# Patient Record
Sex: Female | Born: 1935 | Race: White | Hispanic: No | Smoking: Never smoker
Health system: Southern US, Community
[De-identification: ages and names within clinical notes are randomized; demographics above are authoritative.]

## PROBLEM LIST (undated history)

## (undated) DIAGNOSIS — I2699 Other pulmonary embolism without acute cor pulmonale: Secondary | ICD-10-CM

## (undated) DIAGNOSIS — K219 Gastro-esophageal reflux disease without esophagitis: Secondary | ICD-10-CM

## (undated) DIAGNOSIS — C189 Malignant neoplasm of colon, unspecified: Secondary | ICD-10-CM

## (undated) DIAGNOSIS — E039 Hypothyroidism, unspecified: Secondary | ICD-10-CM

## (undated) DIAGNOSIS — G8929 Other chronic pain: Secondary | ICD-10-CM

## (undated) DIAGNOSIS — F419 Anxiety disorder, unspecified: Secondary | ICD-10-CM

## (undated) DIAGNOSIS — I82409 Acute embolism and thrombosis of unspecified deep veins of unspecified lower extremity: Secondary | ICD-10-CM

## (undated) DIAGNOSIS — A4901 Methicillin susceptible Staphylococcus aureus infection, unspecified site: Secondary | ICD-10-CM

## (undated) HISTORY — PX: TUBAL LIGATION: SHX77

## (undated) HISTORY — PX: HERNIA REPAIR: SHX51

## (undated) HISTORY — PX: TOTAL HIP ARTHROPLASTY: SHX124

## (undated) HISTORY — PX: BLADDER SURGERY: SHX569

## (undated) HISTORY — PX: TONSILLECTOMY: SUR1361

## (undated) HISTORY — PX: CHOLECYSTECTOMY: SHX55

## (undated) HISTORY — PX: OTHER SURGICAL HISTORY: SHX169

---

## 2017-09-30 ENCOUNTER — Inpatient Hospital Stay
Admission: EM | Admit: 2017-09-30 | Discharge: 2017-10-03 | DRG: 291 | Disposition: A | Discharge status: Home / Self Care | Payer: Medicare Other | Attending: Internal Medicine | Admitting: Internal Medicine

## 2017-09-30 ENCOUNTER — Other Ambulatory Visit: Payer: Self-pay

## 2017-09-30 ENCOUNTER — Emergency Department: Payer: Medicare Other

## 2017-09-30 ENCOUNTER — Inpatient Hospital Stay: Admit: 2017-09-30 | Payer: Medicare Other

## 2017-09-30 DIAGNOSIS — B958 Unspecified staphylococcus as the cause of diseases classified elsewhere: Secondary | ICD-10-CM | POA: Diagnosis present

## 2017-09-30 DIAGNOSIS — G8929 Other chronic pain: Secondary | ICD-10-CM | POA: Diagnosis present

## 2017-09-30 DIAGNOSIS — F419 Anxiety disorder, unspecified: Secondary | ICD-10-CM | POA: Diagnosis present

## 2017-09-30 DIAGNOSIS — I5033 Acute on chronic diastolic (congestive) heart failure: Secondary | ICD-10-CM | POA: Diagnosis present

## 2017-09-30 DIAGNOSIS — Z96643 Presence of artificial hip joint, bilateral: Secondary | ICD-10-CM | POA: Diagnosis present

## 2017-09-30 DIAGNOSIS — K219 Gastro-esophageal reflux disease without esophagitis: Secondary | ICD-10-CM | POA: Diagnosis present

## 2017-09-30 DIAGNOSIS — Z86711 Personal history of pulmonary embolism: Secondary | ICD-10-CM

## 2017-09-30 DIAGNOSIS — Z85038 Personal history of other malignant neoplasm of large intestine: Secondary | ICD-10-CM

## 2017-09-30 DIAGNOSIS — I272 Pulmonary hypertension, unspecified: Secondary | ICD-10-CM | POA: Diagnosis present

## 2017-09-30 DIAGNOSIS — I509 Heart failure, unspecified: Secondary | ICD-10-CM

## 2017-09-30 DIAGNOSIS — Z91040 Latex allergy status: Secondary | ICD-10-CM

## 2017-09-30 DIAGNOSIS — J181 Lobar pneumonia, unspecified organism: Secondary | ICD-10-CM | POA: Diagnosis present

## 2017-09-30 DIAGNOSIS — Z66 Do not resuscitate: Secondary | ICD-10-CM | POA: Diagnosis present

## 2017-09-30 DIAGNOSIS — Y848 Other medical procedures as the cause of abnormal reaction of the patient, or of later complication, without mention of misadventure at the time of the procedure: Secondary | ICD-10-CM | POA: Diagnosis present

## 2017-09-30 DIAGNOSIS — Z86718 Personal history of other venous thrombosis and embolism: Secondary | ICD-10-CM | POA: Diagnosis not present

## 2017-09-30 DIAGNOSIS — I5030 Unspecified diastolic (congestive) heart failure: Secondary | ICD-10-CM | POA: Diagnosis not present

## 2017-09-30 DIAGNOSIS — E039 Hypothyroidism, unspecified: Secondary | ICD-10-CM | POA: Diagnosis present

## 2017-09-30 DIAGNOSIS — Z79899 Other long term (current) drug therapy: Secondary | ICD-10-CM

## 2017-09-30 DIAGNOSIS — Z79891 Long term (current) use of opiate analgesic: Secondary | ICD-10-CM

## 2017-09-30 DIAGNOSIS — Z880 Allergy status to penicillin: Secondary | ICD-10-CM

## 2017-09-30 DIAGNOSIS — Z79890 Hormone replacement therapy (postmenopausal): Secondary | ICD-10-CM

## 2017-09-30 DIAGNOSIS — I110 Hypertensive heart disease with heart failure: Principal | ICD-10-CM | POA: Diagnosis present

## 2017-09-30 DIAGNOSIS — Z7901 Long term (current) use of anticoagulants: Secondary | ICD-10-CM | POA: Diagnosis not present

## 2017-09-30 DIAGNOSIS — J9601 Acute respiratory failure with hypoxia: Secondary | ICD-10-CM | POA: Diagnosis present

## 2017-09-30 DIAGNOSIS — J189 Pneumonia, unspecified organism: Secondary | ICD-10-CM | POA: Diagnosis present

## 2017-09-30 DIAGNOSIS — Z8249 Family history of ischemic heart disease and other diseases of the circulatory system: Secondary | ICD-10-CM

## 2017-09-30 DIAGNOSIS — Z888 Allergy status to other drugs, medicaments and biological substances status: Secondary | ICD-10-CM

## 2017-09-30 DIAGNOSIS — Z91018 Allergy to other foods: Secondary | ICD-10-CM | POA: Diagnosis not present

## 2017-09-30 DIAGNOSIS — S60222A Contusion of left hand, initial encounter: Secondary | ICD-10-CM | POA: Diagnosis present

## 2017-09-30 HISTORY — DX: Other chronic pain: G89.29

## 2017-09-30 HISTORY — DX: Methicillin susceptible Staphylococcus aureus infection, unspecified site: A49.01

## 2017-09-30 HISTORY — DX: Anxiety disorder, unspecified: F41.9

## 2017-09-30 HISTORY — DX: Other pulmonary embolism without acute cor pulmonale: I26.99

## 2017-09-30 HISTORY — DX: Malignant neoplasm of colon, unspecified: C18.9

## 2017-09-30 HISTORY — DX: Gastro-esophageal reflux disease without esophagitis: K21.9

## 2017-09-30 HISTORY — DX: Hypothyroidism, unspecified: E03.9

## 2017-09-30 HISTORY — DX: Acute embolism and thrombosis of unspecified deep veins of unspecified lower extremity: I82.409

## 2017-09-30 LAB — BLOOD GAS, VENOUS
PH VEN: 7.4 (ref 7.250–7.430)
Patient temperature: 37
pCO2, Ven: 45 mmHg (ref 44.0–60.0)
pO2, Ven: 41 mmHg (ref 32.0–45.0)

## 2017-09-30 LAB — CBC
HCT: 37.3 % (ref 35.0–47.0)
Hemoglobin: 12.4 g/dL (ref 12.0–16.0)
MCH: 29.9 pg (ref 26.0–34.0)
MCHC: 33.3 g/dL (ref 32.0–36.0)
MCV: 90 fL (ref 80.0–100.0)
PLATELETS: 272 10*3/uL (ref 150–440)
RBC: 4.14 MIL/uL (ref 3.80–5.20)
RDW: 14.8 % — AB (ref 11.5–14.5)
WBC: 6.2 10*3/uL (ref 3.6–11.0)

## 2017-09-30 LAB — BASIC METABOLIC PANEL
Anion gap: 13 (ref 5–15)
BUN: 15 mg/dL (ref 8–23)
CALCIUM: 8.9 mg/dL (ref 8.9–10.3)
CO2: 25 mmol/L (ref 22–32)
CREATININE: 0.7 mg/dL (ref 0.44–1.00)
Chloride: 102 mmol/L (ref 98–111)
GFR calc Af Amer: >60 mL/min (ref >60)
GLUCOSE: 127 mg/dL — AB (ref 70–99)
Potassium: 4 mmol/L (ref 3.5–5.1)
Sodium: 140 mmol/L (ref 135–145)

## 2017-09-30 LAB — PROTIME-INR
INR: 2.06
Prothrombin Time: 23 seconds — ABNORMAL HIGH (ref 11.4–15.2)

## 2017-09-30 LAB — TROPONIN I
Troponin I: <0.03 ng/mL (ref <0.03)
Troponin I: 0.03 ng/mL (ref <0.03)
Troponin I: <0.03 ng/mL (ref <0.03)

## 2017-09-30 LAB — PROCALCITONIN

## 2017-09-30 LAB — BRAIN NATRIURETIC PEPTIDE: B Natriuretic Peptide: 746 pg/mL — ABNORMAL HIGH (ref 0.0–100.0)

## 2017-09-30 LAB — MRSA PCR SCREENING: MRSA by PCR: NEGATIVE

## 2017-09-30 MED ORDER — FUROSEMIDE 10 MG/ML IJ SOLN
40.0000 mg | Freq: Every day | INTRAMUSCULAR | Status: DC
  Administered 2017-10-01 – 2017-10-02 (×2): 40 mg via INTRAVENOUS
  Filled 2017-09-30 (×2): qty 4

## 2017-09-30 MED ORDER — FUROSEMIDE 10 MG/ML IJ SOLN
40.0000 mg | Freq: Once | INTRAMUSCULAR | Status: AC
  Administered 2017-09-30: 40 mg via INTRAVENOUS
  Filled 2017-09-30: qty 4

## 2017-09-30 MED ORDER — LEVOFLOXACIN IN D5W 500 MG/100ML IV SOLN
500.0000 mg | Freq: Once | INTRAVENOUS | Status: AC
  Administered 2017-09-30: 16:00:00 500 mg via INTRAVENOUS
  Filled 2017-09-30: qty 100

## 2017-09-30 MED ORDER — IPRATROPIUM-ALBUTEROL 0.5-2.5 (3) MG/3ML IN SOLN
3.0000 mL | Freq: Once | RESPIRATORY_TRACT | Status: AC
  Administered 2017-09-30: 3 mL via RESPIRATORY_TRACT
  Filled 2017-09-30: qty 3

## 2017-09-30 MED ORDER — ACETAMINOPHEN 325 MG PO TABS: 650.0000 mg | ORAL_TABLET | Freq: Four times a day (QID) | ORAL | Status: DC | PRN

## 2017-09-30 MED ORDER — NITROGLYCERIN 0.4 MG SL SUBL: 0.4000 mg | SUBLINGUAL_TABLET | SUBLINGUAL | Status: DC | PRN

## 2017-09-30 MED ORDER — SODIUM CHLORIDE 0.9 % IV SOLN: 2.0000 g | Freq: Once | INTRAVENOUS | Status: DC

## 2017-09-30 MED ORDER — DICLOFENAC SODIUM 1 % TD GEL
2.0000 g | Freq: Four times a day (QID) | TRANSDERMAL | Status: DC | PRN
  Filled 2017-09-30: qty 100

## 2017-09-30 MED ORDER — MAGNESIUM OXIDE 400 (241.3 MG) MG PO TABS
400.0000 mg | ORAL_TABLET | Freq: Every day | ORAL | Status: DC
  Administered 2017-10-01 – 2017-10-03 (×3): 400 mg via ORAL
  Filled 2017-09-30 (×3): qty 1

## 2017-09-30 MED ORDER — ACETAMINOPHEN 650 MG RE SUPP: 650.0000 mg | Freq: Four times a day (QID) | RECTAL | Status: DC | PRN

## 2017-09-30 MED ORDER — POLYVINYL ALCOHOL-POVIDONE PF 1.4-0.6 % OP SOLN: 2.0000 [drp] | OPHTHALMIC | Status: DC | PRN

## 2017-09-30 MED ORDER — ONDANSETRON HCL 4 MG/2ML IJ SOLN: 4.0000 mg | Freq: Four times a day (QID) | INTRAMUSCULAR | Status: DC | PRN

## 2017-09-30 MED ORDER — POLYVINYL ALCOHOL 1.4 % OP SOLN
1.0000 [drp] | OPHTHALMIC | Status: DC | PRN
  Filled 2017-09-30: qty 15

## 2017-09-30 MED ORDER — GABAPENTIN 100 MG PO CAPS
100.0000 mg | ORAL_CAPSULE | Freq: Three times a day (TID) | ORAL | Status: DC
  Administered 2017-09-30 – 2017-10-03 (×10): 100 mg via ORAL
  Filled 2017-09-30 (×10): qty 1

## 2017-09-30 MED ORDER — VANCOMYCIN HCL IN DEXTROSE 1-5 GM/200ML-% IV SOLN
1000.0000 mg | Freq: Once | INTRAVENOUS | Status: AC
  Administered 2017-09-30: 1000 mg via INTRAVENOUS
  Filled 2017-09-30: qty 200

## 2017-09-30 MED ORDER — CALCIUM CARBONATE-VITAMIN D 500-200 MG-UNIT PO TABS
1.0000 | ORAL_TABLET | Freq: Every day | ORAL | Status: DC
  Administered 2017-10-01 – 2017-10-03 (×3): 1 via ORAL
  Filled 2017-09-30 (×3): qty 1

## 2017-09-30 MED ORDER — WARFARIN - PHARMACIST DOSING INPATIENT: Freq: Every day | Status: DC

## 2017-09-30 MED ORDER — LEVOFLOXACIN IN D5W 250 MG/50ML IV SOLN
250.0000 mg | INTRAVENOUS | Status: DC
  Administered 2017-10-01 – 2017-10-03 (×3): 250 mg via INTRAVENOUS
  Filled 2017-09-30 (×4): qty 50

## 2017-09-30 MED ORDER — WARFARIN - PHARMACIST DOSING INPATIENT
Freq: Every day | Status: DC
  Administered 2017-10-01: 18:00:00

## 2017-09-30 MED ORDER — FERROUS SULFATE 325 (65 FE) MG PO TABS
325.0000 mg | ORAL_TABLET | Freq: Every day | ORAL | Status: DC
  Administered 2017-10-01 – 2017-10-03 (×3): 325 mg via ORAL
  Filled 2017-09-30 (×4): qty 1

## 2017-09-30 MED ORDER — POLYETHYLENE GLYCOL 3350 17 G PO PACK
17.0000 g | PACK | Freq: Every day | ORAL | Status: DC
  Administered 2017-10-01 – 2017-10-02 (×2): 17 g via ORAL
  Filled 2017-09-30 (×3): qty 1

## 2017-09-30 MED ORDER — CITALOPRAM HYDROBROMIDE 20 MG PO TABS
40.0000 mg | ORAL_TABLET | Freq: Every day | ORAL | Status: DC
  Administered 2017-10-01 – 2017-10-03 (×3): 40 mg via ORAL
  Filled 2017-09-30 (×3): qty 2

## 2017-09-30 MED ORDER — PANTOPRAZOLE SODIUM 40 MG PO TBEC
40.0000 mg | DELAYED_RELEASE_TABLET | Freq: Every day | ORAL | Status: DC
  Administered 2017-10-01 – 2017-10-03 (×3): 40 mg via ORAL
  Filled 2017-09-30 (×3): qty 1

## 2017-09-30 MED ORDER — METOPROLOL TARTRATE 25 MG PO TABS
25.0000 mg | ORAL_TABLET | Freq: Two times a day (BID) | ORAL | Status: DC
  Administered 2017-09-30 – 2017-10-03 (×6): 25 mg via ORAL
  Filled 2017-09-30 (×6): qty 1

## 2017-09-30 MED ORDER — ONDANSETRON HCL 4 MG PO TABS: 4.0000 mg | ORAL_TABLET | Freq: Four times a day (QID) | ORAL | Status: DC | PRN

## 2017-09-30 MED ORDER — LEVOTHYROXINE SODIUM 112 MCG PO TABS
112.0000 ug | ORAL_TABLET | ORAL | Status: DC
  Administered 2017-10-01 – 2017-10-03 (×3): 112 ug via ORAL
  Filled 2017-09-30 (×3): qty 1

## 2017-09-30 MED ORDER — SODIUM CHLORIDE 0.9% FLUSH
3.0000 mL | Freq: Two times a day (BID) | INTRAVENOUS | Status: DC
  Administered 2017-09-30 – 2017-10-02 (×6): 3 mL via INTRAVENOUS

## 2017-09-30 MED ORDER — OLOPATADINE HCL 0.1 % OP SOLN
1.0000 [drp] | Freq: Every morning | OPHTHALMIC | Status: DC
  Administered 2017-10-01 – 2017-10-03 (×3): 1 [drp] via OPHTHALMIC
  Filled 2017-09-30: qty 5

## 2017-09-30 MED ORDER — METHYLPREDNISOLONE SODIUM SUCC 125 MG IJ SOLR
80.0000 mg | INTRAMUSCULAR | Status: AC
  Administered 2017-09-30: 80 mg via INTRAVENOUS
  Filled 2017-09-30: qty 2

## 2017-09-30 MED ORDER — CARBAMIDE PEROXIDE 6.5 % OT SOLN
5.0000 [drp] | Freq: Two times a day (BID) | OTIC | Status: DC
  Administered 2017-09-30 – 2017-10-03 (×7): 5 [drp] via OTIC
  Filled 2017-09-30 (×2): qty 15

## 2017-09-30 MED ORDER — SODIUM CHLORIDE 0.9 % IV SOLN
2.0000 g | Freq: Once | INTRAVENOUS | Status: AC
  Administered 2017-09-30: 2 g via INTRAVENOUS
  Filled 2017-09-30: qty 2

## 2017-09-30 MED ORDER — DOXYCYCLINE HYCLATE 100 MG PO TABS
100.0000 mg | ORAL_TABLET | Freq: Two times a day (BID) | ORAL | Status: DC
  Administered 2017-09-30 – 2017-10-03 (×6): 100 mg via ORAL
  Filled 2017-09-30 (×7): qty 1

## 2017-09-30 MED ORDER — OXYCODONE HCL ER 10 MG PO T12A
10.0000 mg | EXTENDED_RELEASE_TABLET | Freq: Two times a day (BID) | ORAL | Status: DC
  Administered 2017-09-30 – 2017-10-03 (×6): 10 mg via ORAL
  Filled 2017-09-30 (×6): qty 1

## 2017-09-30 MED ORDER — VANCOMYCIN HCL IN DEXTROSE 1-5 GM/200ML-% IV SOLN: 1000.0000 mg | Freq: Once | INTRAVENOUS | Status: DC

## 2017-09-30 NOTE — ED Provider Notes (Signed)
[facility] Emergency Department Provider Note  ____________________________________________   First MD Initiated Contact with Patient 09/30/17 0900     (approximate)  I have reviewed the triage vital signs and the nursing notes.   HISTORY  Chief Complaint Shortness of Breath    HPI Audrey Wheeler is a 84 y.o. female here for shortness of breath.  Good have a history of congestive heart failure previous DVT on Coumadin therapy for several years   Yesterday began to feel short of breath.  Last night noted to be wheezing by family, fatigue and more short of breath with walking over the last day.  No chest pain.  No leg swelling.  She is been eating with good appetite.  No fevers or chills.  Slight nonproductive cough.  Felt fairly short of breath this morning, better now on oxygen.  Does not have a history of COPD, but does have a history of congestive heart failure according to family.  Currently not in any pain.  Breathing improved on oxygen.   History reviewed. No pertinent past medical history.  Patient Active Problem List   Diagnosis Date Noted  . Pneumonia 09/30/2017    History reviewed. No pertinent surgical history.  Prior to Admission medications   Medication Sig Start Date End Date Taking? Authorizing Provider  citalopram (CELEXA) 40 MG tablet Take 40 mg by mouth daily. 08/06/17  Yes [provider]  doxycycline (VIBRAMYCIN) 100 MG capsule Take 100 mg by mouth 2 (two) times daily. 09/25/17  Yes [provider]  furosemide (LASIX) 40 MG tablet Take 40 mg by mouth daily. 07/02/17  Yes [provider]  gabapentin (NEURONTIN) 100 MG capsule Take 100 mg by mouth 3 (three) times daily. 09/08/17  Yes [provider]  levothyroxine (SYNTHROID, LEVOTHROID) 112 MCG tablet Take 112 mcg by mouth daily. 08/13/17  Yes [provider]  metoprolol tartrate (LOPRESSOR) 25 MG tablet Take 25 mg by mouth 2 (two) times daily.  09/22/17  Yes [provider]  nitroGLYCERIN (NITROSTAT) 0.4 MG SL tablet Place 0.4 mg under the tongue as needed for chest pain. 02/09/16  Yes [provider]  OXYCONTIN 10 MG 12 hr tablet Take 10 mg by mouth every 12 (twelve) hours. 08/30/17  Yes [provider]  warfarin (COUMADIN) 4 MG tablet Take 2-4 mg by mouth daily. Take 2 mg by mouth on Monday, Wednesday and Friday. Take 4 mg by mouth on all other days. 08/12/17  Yes [provider]    Allergies Penicillins; Celebrex [celecoxib]; Clindamycin/lincomycin; Latex; Sulfa antibiotics; and Strawberry leaves extract  History reviewed. No pertinent family history.  Social History Social History   Tobacco Use  . Smoking status: Never Smoker  . Smokeless tobacco: Never Used  Substance Use Topics  . Alcohol use: Never    Frequency: Never  . Drug use: Never    Review of Systems Constitutional: No fever/chills Eyes: No visual changes. ENT: No sore throat. Cardiovascular: Denies chest pain. Respiratory: See HPI gastrointestinal: No abdominal pain.  No nausea, no vomiting.  No diarrhea.  No constipation. Genitourinary: Negative for dysuria. Musculoskeletal: Negative for back pain. Skin: Negative for rash. Neurological: Negative for headaches, focal weakness or numbness.    ____________________________________________   PHYSICAL EXAM:  VITAL SIGNS: ED Triage Vitals  Enc Vitals Group     BP 09/30/17 0857 (!) 180/115     Pulse Rate 09/30/17 0857 89     Resp 09/30/17 0857 (!) 22  Temp --      Temp src --      SpO2 09/30/17 0857 92 %     Weight 09/30/17 0907 180 lb (81.6 kg)     Height 09/30/17 0907 5\' 6"  (1.676 m)     Head Circumference --      Peak Flow --      Pain Score 09/30/17 0907 0     Pain Loc --      Pain Edu? --      Excl. in [place]? --     Constitutional: Alert and oriented.  Somewhat frail in appearance.  Very pleasant along with her family at the bedside.  Mild increased work  of breathing Eyes: Conjunctivae are normal. Head: Atraumatic. Nose: No congestion/rhinnorhea. Mouth/Throat: Mucous membranes are moist. Neck: No stridor.   Cardiovascular: Normal rate, regular rhythm. Grossly normal heart sounds.  Good peripheral circulation. Respiratory: There is some very mild accessory muscle use.  She has mild end expiratory wheezing throughout and some slight crackles denoted primary in the left lower lung fields.  She speaks in phrases. Gastrointestinal: Soft and nontender. No distention. Musculoskeletal: No lower extremity tenderness nor edema. Neurologic:  Normal speech and language. No gross focal neurologic deficits are appreciated.  Skin:  Skin is warm, dry and intact. No rash noted. Psychiatric: Mood and affect are normal. Speech and behavior are normal.  ____________________________________________   LABS (all labs ordered are listed, but only abnormal results are displayed)  Labs Reviewed  PROTIME-INR - Abnormal; Notable for the following components:      Result Value   Prothrombin Time 23.0 (*)    All other components within normal limits  CBC - Abnormal; Notable for the following components:   RDW 14.8 (*)    All other components within normal limits  BASIC METABOLIC PANEL - Abnormal; Notable for the following components:   Glucose, Bld 127 (*)    All other components within normal limits  BRAIN NATRIURETIC PEPTIDE - Abnormal; Notable for the following components:   B Natriuretic Peptide 746.0 (*)    All other components within normal limits  CULTURE, BLOOD (ROUTINE X 2)  CULTURE, BLOOD (ROUTINE X 2)  TROPONIN I  BLOOD GAS, VENOUS   ____________________________________________  EKG  Reviewed and entered by me at 9 AM Heart rate 99 QRS 99 QTc 475 Normal sinus rhythm, nonspecific T wave abnormality, no ST elevation.  Possible old anteroseptal infarct. ____________________________________________  RADIOLOGY  Dg Chest 2 View  Result  Date: 09/30/2017 CLINICAL DATA:  Shortness of breath EXAM: CHEST - 2 VIEW COMPARISON:  None. FINDINGS: There is patchy airspace consolidation in the left base with small left pleural effusion. There is mild atelectatic change in the right base. There is mild cardiac enlargement with mild pulmonary venous hypertension. No adenopathy. There is aortic atherosclerosis. There is postoperative change in the right shoulder. IMPRESSION: Airspace consolidation posterior left base, likely due to pneumonia. Small left pleural effusion. Atelectatic change right base. Pulmonary vascular congestion without frank edema. Aortic atherosclerosis. Aortic Atherosclerosis (ICD10-I70.0). Electronically Signed   By: Lowella Grip III M.D.   On: 09/30/2017 09:47     Personally reviewed chest x-ray, appears there is a left lower lung consolidation. ____________________________________________   PROCEDURES  Procedure(s) performed: None  Procedures  Critical Care performed: no  ____________________________________________   INITIAL IMPRESSION / ASSESSMENT AND PLAN / ED COURSE  Pertinent labs & imaging results that were available during my care of the patient were reviewed by me and  considered in my medical decision making (see chart for details).  Shortness of breath.  Patient presents for evaluation of hypoxia, no notable history of pulmonary disease except for CHF as reported by family.  She does demonstrate some rales in the left lower lobe and also some mild end expiratory wheezing throughout with mild increased work of breathing.  Denies chest pain.  No pleuritic pain.  Does have a history of DVT, but is therapeutic on her INR.  She is currently resting improved with oxygen and after one neb treatment her wheezing has cleared, oxygenation mid 90s on 2 L.  Wheezing has improved but rales in the left lower lobe are still evident.  This point will admit to hospital for further work-up under the care of the  hospitalist service, suspect likely community-acquired pneumonia but given her use of doxycycline also order vancomycin and she has multiple previous drug allergies.  Patient family and her daughter at bedside agreeable with plan for admission.  ----------------------------------------- 10:40 AM on 09/30/2017 -----------------------------------------  Dr. Bobbye Charleston currently in room evaluating        ____________________________________________   FINAL CLINICAL IMPRESSION(S) / ED DIAGNOSES  Final diagnoses:  Community acquired pneumonia of left lower lobe of lung ([place])      NEW MEDICATIONS STARTED DURING THIS VISIT:  New Prescriptions   No medications on file     Note:  This document was prepared using Dragon voice recognition software and may include unintentional dictation errors.     Delman Kitten, MD 09/30/17 1040
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
## 2017-09-30 NOTE — ED Notes (Signed)
Report to Deidra, RN  

## 2017-09-30 NOTE — Progress Notes (Signed)
Patient ID: Audrey Wheeler, female   DOB: 06-05-1920, 84 y.o.   MRN: [id number]  ACP note  Patient, daughter and son-in-law at the bedside  Diagnosis: Acute hypoxic respiratory failure, pneumonia, acute CHF, hypertension, hematoma left hand, history of DVT and PE, hypothyroidism, chronic staph infection right knee, chronic pain.  CODE STATUS discussed and patient is a DNR.  Plan.  Oxygen supplementation for acute hypoxic respiratory failure, antibiotics for pneumonia, Lasix for acute congestive heart failure and echocardiogram.  Time spent on ACP discussion 17 minutes Dr. Loletha Grayer

## 2017-09-30 NOTE — Evaluation (Signed)
Physical Therapy Evaluation Patient Details Name: Audrey Wheeler MRN: [id number] DOB: 09-Feb-1920 Today's Date: 09/30/2017   History of Present Illness  84 y.o. female presented to ED with shortness of breath.  X-ray shows pneumonia and pulmonary vascular congestion. PMH of DVT, colon cancer,  anxiety  Clinical Impression  Patient A&Ox4 at start of session, agreeable to PT, O2 repositioned and spO2 >905 throughout session. Patient reports living in an independent living facility, uses rollator for household distances, motorized scooter for community mobility. Reports she needs assistance with ADLs including donning/doffing LLE braces, facility provides meals and cleaning services.  Patient demonstrated bed mobility with mod I due to increased time and use of  bed rails.  Sit <> stand transfer and commode transfer performed with close supervision/CGA. Ambulated to chair ~82ft with RW and CGA. Exhibited trunk flexion, decreased stride length b/l, shuffling step and ER of LLE. After mobility patient reports feeling fatigued and SOB, vitals stable. Overall the patient demonstrates limitations in activity tolerance, endurance, strength, mobility, and gait compared to PLOF. Current recommendation is STR to maximize mobility and safety, as well as decreased caregiver support for household ambulation. If patient were to return home, needs include: bedside commode, RW, and 24/7 supervision.     Follow Up Recommendations SNF    Equipment Recommendations  Rolling walker with 5" wheels    Recommendations for Other Services       Precautions / Restrictions Precautions Precautions: Fall Restrictions Weight Bearing Restrictions: No      Mobility  Bed Mobility Overal bed mobility: Needs Assistance Bed Mobility: Supine to Sit;Sit to Supine     Supine to sit: Modified independent (Device/Increase time) Sit to supine: Modified independent (Device/Increase time)      Transfers Overall transfer level:  Needs assistance Equipment used: Rolling walker (2 wheeled) Transfers: Sit to/from Stand Sit to Stand: Min guard;Supervision            Ambulation/Gait Ambulation/Gait assistance: Min Gaffer (Feet): 3 Feet Assistive device: Rolling walker (2 wheeled) Gait Pattern/deviations: Decreased stride length;Decreased step length - right;Decreased step length - left;Shuffle;Trunk flexed Gait velocity: velocity decreased      Stairs            Wheelchair Mobility    Modified Rankin (Stroke Patients Only)       Balance Overall balance assessment: Needs assistance Sitting-balance support: Feet supported Sitting balance-Leahy Scale: Good       Standing balance-Leahy Scale: Poor                               Pertinent Vitals/Pain Pain Assessment: No/denies pain    Home Living Family/patient expects to be discharged to:: Other (Comment)(independent living) Living Arrangements: Alone Available Help at Discharge: Personal care attendant;Family Type of Home: Independent living facility Home Access: Elevator     Home Layout: One level Home Equipment: Grab bars - toilet;Electric scooter;Walker - 2 wheels;Grab bars - tub/shower;Shower seat;Hand held shower head;Shower seat - built in;Walker - 4 wheels      Prior Function Level of Independence: Needs assistance   Gait / Transfers Assistance Needed: Patient ambulates in apartment in facility with rollator for household distances. Uses electric scooter outside of home  ADL's / Homemaking Assistance Needed: Patient needs assistance with bathing, donning/doffing braces for LLE. Meals are provided and facility does cleaning.        Hand Dominance        Extremity/Trunk Assessment  Upper Extremity Assessment Upper Extremity Assessment: Generalized weakness    Lower Extremity Assessment Lower Extremity Assessment: Generalized weakness       Communication   Communication: No  difficulties  Cognition Arousal/Alertness: Awake/alert Behavior During Therapy: WFL for tasks assessed/performed Overall Cognitive Status: Within Functional Limits for tasks assessed                                        General Comments      Exercises Other Exercises Other Exercises: Patient demonstrated bedside commode transfer with CGA/close supervision and use of rails. standby assist for toileting needs.    Assessment/Plan    PT Assessment Patient needs continued PT services  PT Problem List Decreased strength;Decreased range of motion;Decreased activity tolerance;Decreased balance;Decreased mobility       PT Treatment Interventions DME instruction;Balance training;Gait training;Neuromuscular re-education;Functional mobility training;Patient/family education;Therapeutic activities;Therapeutic exercise    PT Goals (Current goals can be found in the Care Plan section)  Acute Rehab PT Goals Patient Stated Goal: Patient wants to return to PLOF PT Goal Formulation: With patient Time For Goal Achievement: 10/14/17 Potential to Achieve Goals: Good    Frequency Min 2X/week   Barriers to discharge Decreased caregiver support      Co-evaluation               AM-PAC PT "6 Clicks" Daily Activity  Outcome Measure Difficulty turning over in bed (including adjusting bedclothes, sheets and blankets)?: A Little Difficulty moving from lying on back to sitting on the side of the bed? : A Little Difficulty sitting down on and standing up from a chair with arms (e.g., wheelchair, bedside commode, etc,.)?: A Lot Help needed moving to and from a bed to chair (including a wheelchair)?: A Little Help needed walking in hospital room?: A Lot Help needed climbing 3-5 steps with a railing? : Total 6 Click Score: 14    End of Session Equipment Utilized During Treatment: Gait belt Activity Tolerance: Patient limited by fatigue Patient left: in chair;with chair alarm  set;with call bell/phone within reach Nurse Communication: Mobility status PT Visit Diagnosis: Unsteadiness on feet (R26.81);Other abnormalities of gait and mobility (R26.89);Muscle weakness (generalized) (M62.81)    Time: 2233-6122 PT Time Calculation (min) (ACUTE ONLY): 32 min   Charges:   PT Evaluation $PT Eval Low Complexity: 1 Low PT Treatments $Therapeutic Activity: 8-22 mins        Lieutenant Diego PT, DPT 2:58 PM,09/30/17 [phone]

## 2017-09-30 NOTE — NC FL2 (Signed)
[place] LEVEL OF CARE SCREENING TOOL     IDENTIFICATION  Patient Name: Audrey Wheeler Birthdate: [birth year] Sex: female Admission Date (Current Location): 09/30/2017  [place] and [place] Number:  Engineering geologist and Address:  [facility], [address] [place], [place] [address]      Provider Number: [id number]  Attending Physician Name and Address:  Loletha Grayer, MD  Relative Name and Phone Number:       Current Level of Care: Hospital Recommended Level of Care: [place] Prior Approval Number:    Date Approved/Denied:   PASRR Number: [id number]  Discharge Plan: SNF    Current Diagnoses: Patient Active Problem List   Diagnosis Date Noted  . Pneumonia 09/30/2017    Orientation RESPIRATION BLADDER Height & Weight     Self, Time, Situation, Place  O2(2 liters ) Continent Weight: 156 lb (70.8 kg) Height:  5\' 7"  (170.2 cm)  BEHAVIORAL SYMPTOMS/MOOD NEUROLOGICAL BOWEL NUTRITION STATUS  (None) (none) Continent Diet(Heart Healthy )  AMBULATORY STATUS COMMUNICATION OF NEEDS Skin   Extensive Assist Verbally Normal                       Personal Care Assistance Level of Assistance  Bathing, Feeding, Dressing Bathing Assistance: Limited assistance Feeding assistance: Independent Dressing Assistance: Limited assistance     Functional Limitations Info  Sight, Hearing, Speech Sight Info: Adequate Hearing Info: Adequate Speech Info: Adequate    SPECIAL CARE FACTORS FREQUENCY  OT (By licensed OT), PT (By licensed PT)     PT Frequency: 5 OT Frequency: 5            Contractures Contractures Info: Not present    Additional Factors Info  Code Status, Allergies Code Status Info: DNR Allergies Info: Penicillins, Celebrex Celecoxib, Clindamycin/lincomycin, Latex, Sulfa Antibiotics, Strawberry Leaves Extract           Current Medications (09/30/2017):  This is the current hospital active  medication list Current Facility-Administered Medications  Medication Dose Route Frequency Provider Last Rate Last Dose  . acetaminophen (TYLENOL) tablet 650 mg  650 mg Oral Q6H PRN Loletha Grayer, MD       Or  . acetaminophen (TYLENOL) suppository 650 mg  650 mg Rectal Q6H PRN Loletha Grayer, MD      . Derrill Memo ON 10/01/2017] calcium-vitamin D (OSCAL WITH D) 500-200 MG-UNIT per tablet 1 tablet  1 tablet Oral Daily Wieting, Richard, MD      . carbamide peroxide (DEBROX) 6.5 % OTIC (EAR) solution 5 drop  5 drop Left EAR BID Loletha Grayer, MD   5 drop at 09/30/17 1539  . [START ON 10/01/2017] citalopram (CELEXA) tablet 40 mg  40 mg Oral Daily Wieting, Richard, MD      . diclofenac sodium (VOLTAREN) 1 % transdermal gel 2 g  2 g Topical QID PRN Wieting, Richard, MD      . doxycycline (VIBRA-TABS) tablet 100 mg  100 mg Oral Q12H Wieting, Richard, MD      . Derrill Memo ON 10/01/2017] ferrous sulfate tablet 325 mg  325 mg Oral Daily Wieting, Richard, MD      . Derrill Memo ON 10/01/2017] furosemide (LASIX) injection 40 mg  40 mg Intravenous Daily Wieting, Richard, MD      . gabapentin (NEURONTIN) capsule 100 mg  100 mg Oral TID Loletha Grayer, MD   100 mg at 09/30/17 1539  . [START ON 10/01/2017] Levofloxacin (LEVAQUIN) IVPB 250 mg  250 mg Intravenous  Q24H Wieting, Richard, MD      . levofloxacin [facility]) IVPB 500 mg  500 mg Intravenous Once Loletha Grayer, MD 100 mL/hr at 09/30/17 1544 500 mg at 09/30/17 1544  . [START ON 10/01/2017] levothyroxine (SYNTHROID, LEVOTHROID) tablet 112 mcg  112 mcg Oral Haskel Khan, Richard, MD      . Derrill Memo ON 10/01/2017] magnesium oxide (MAG-OX) tablet 400 mg  400 mg Oral Daily Wieting, Richard, MD      . metoprolol tartrate (LOPRESSOR) tablet 25 mg  25 mg Oral BID Wieting, Richard, MD      . nitroGLYCERIN (NITROSTAT) SL tablet 0.4 mg  0.4 mg Sublingual PRN Loletha Grayer, MD      . Derrill Memo ON 10/01/2017] olopatadine (PATANOL) 0.1 % ophthalmic solution 1 drop  1 drop Both Eyes q  morning - 10a Wieting, Richard, MD      . ondansetron [facility]) tablet 4 mg  4 mg Oral Q6H PRN Loletha Grayer, MD       Or  . ondansetron (ZOFRAN) injection 4 mg  4 mg Intravenous Q6H PRN Wieting, Richard, MD      . oxyCODONE (OXYCONTIN) 12 hr tablet 10 mg  10 mg Oral Q12H Wieting, Richard, MD      . Derrill Memo ON 10/01/2017] pantoprazole (PROTONIX) EC tablet 40 mg  40 mg Oral Daily Wieting, Richard, MD      . Derrill Memo ON 10/01/2017] polyethylene glycol (MIRALAX / GLYCOLAX) packet 17 g  17 g Oral Daily Wieting, Richard, MD      . polyvinyl alcohol (LIQUIFILM TEARS) 1.4 % ophthalmic solution 1 drop  1 drop Both Eyes Q4H PRN Loletha Grayer, MD      . Derrill Memo ON 10/01/2017] Warfarin - Pharmacist Dosing Inpatient   Does not apply [id number] Loletha Grayer, MD         Discharge Medications: Please see discharge summary for a list of discharge medications.  Relevant Imaging Results:  Relevant Lab Results:   Additional Information SSN: [id number]  Annamaria Boots, [place]

## 2017-09-30 NOTE — Progress Notes (Signed)
ANTICOAGULATION CONSULT NOTE - Initial Consult  Pharmacy Consult for Warfarin Indication: VTE prophylaxis  Hx PE/ DVT  Allergies  Allergen Reactions  . Penicillins     Has patient had a PCN reaction causing immediate rash, facial/tongue/throat swelling, SOB or lightheadedness with hypotension: Unknown Has patient had a PCN reaction causing severe rash involving mucus membranes or skin necrosis: Unknown Has patient had a PCN reaction that required hospitalization: Unknown Has patient had a PCN reaction occurring within the last 10 years: Unknown If all of the above answers are "NO", then may proceed with Cephalosporin use.   . Celebrex [Celecoxib]   . Clindamycin/Lincomycin   . Latex   . Sulfa Antibiotics   . Strawberry Leaves Extract     Patient Measurements: Height: 5\' 7"  (170.2 cm) Weight: 156 lb (70.8 kg) IBW/kg (Calculated) : 61.6 Heparin Dosing Weight:    Vital Signs: Temp: 98.1 F (36.7 C) (09/06 1221) Temp Source: Oral (09/06 1221) BP: 158/87 (09/06 1221) Pulse Rate: 94 (09/06 1221)  Labs: Recent Labs    09/30/17 0900  HGB 12.4  HCT 37.3  PLT 272  LABPROT 23.0*  INR 2.06  CREATININE 0.70  TROPONINI <0.03    Estimated Creatinine Clearance: 41.8 mL/min (by C-G formula based on SCr of 0.7 mg/dL).   Medical History: Past Medical History:  Diagnosis Date  . Anxiety   . Chronic pain   . Colon cancer ([place])   . DVT (deep venous thrombosis) ([place])   . GERD (gastroesophageal reflux disease)   . Hypothyroidism   . Pulmonary embolism ([place])   . Staph aureus infection     Medications:  Scheduled:  . [START ON 10/01/2017] calcium-vitamin D  1 tablet Oral Daily  . carbamide peroxide  5 drop Left EAR BID  . [START ON 10/01/2017] citalopram  40 mg Oral Daily  . doxycycline  100 mg Oral Q12H  . [START ON 10/01/2017] ferrous sulfate  325 mg Oral Daily  . [START ON 10/01/2017] furosemide  40 mg Intravenous Daily  . gabapentin  100 mg Oral TID  . [START ON 10/01/2017]  levothyroxine  112 mcg Oral BH-q7a  . [START ON 10/01/2017] magnesium oxide  400 mg Oral Daily  . metoprolol tartrate  25 mg Oral BID  . [START ON 10/01/2017] olopatadine  1 drop Both Eyes q morning - 10a  . oxyCODONE  10 mg Oral Q12H  . [START ON 10/01/2017] pantoprazole  40 mg Oral Daily  . [START ON 10/01/2017] polyethylene glycol  17 g Oral Daily  . Warfarin - Pharmacist Dosing Inpatient   Does not apply q1800   Infusions:  . [START ON 10/01/2017] levofloxacin (LEVAQUIN) IV    . levofloxacin (LEVAQUIN) IV    . vancomycin 1,000 mg (09/30/17 1151)    Assessment: 84 yo F on Warfarin PTA for hx of DVT/PE. Per MD consult- hold Warfarin today 09/30/17 d/t patient with large hematoma on hand. Home Warfarin:   2 mg Mon, Wed, Fri, Sun         4 mg  Tues, Thur, Sat  9/6  INR  2.06  Goal of Therapy:  INR 2-3 Monitor platelets by anticoagulation protocol: Yes   Plan:  Per MD request- Will hold Warfarin today 09/30/17 d/t patient with large hematoma on hand and starting Levaquin. Also, Patient on doxycyline PTA which has been continued. Will f/u INR in am and assess for further dosing  Melis Trochez A 09/30/2017,12:39 PM

## 2017-09-30 NOTE — Progress Notes (Signed)
Pharmacy Antibiotic Note  Audrey Wheeler is a 84 y.o. female admitted on 09/30/2017 with pneumonia.  Pharmacy has been consulted for Levaquin dosing.   Plan: Patient received Aztreonam 2 gm IV x 1 in ER and Vancomycin 1 gram IV x 1 in ER. Will order Levaquin 500mg  IV x 1 then Levaquin 250 mg IV q24h for Crcl 41.8 ml/min.  Note: per H&P patient with chronic Staph infection of R knee- on doxycyline   Height: 5\' 7"  (170.2 cm) Weight: 156 lb (70.8 kg) IBW/kg (Calculated) : 61.6  Temp (24hrs), Avg:98.1 F (36.7 C), Min:98.1 F (36.7 C), Max:98.1 F (36.7 C)  Recent Labs  Lab 09/30/17 0900  WBC 6.2  CREATININE 0.70    Estimated Creatinine Clearance: 41.8 mL/min (by C-G formula based on SCr of 0.7 mg/dL).    Allergies  Allergen Reactions  . Penicillins     Has patient had a PCN reaction causing immediate rash, facial/tongue/throat swelling, SOB or lightheadedness with hypotension: Unknown Has patient had a PCN reaction causing severe rash involving mucus membranes or skin necrosis: Unknown Has patient had a PCN reaction that required hospitalization: Unknown Has patient had a PCN reaction occurring within the last 10 years: Unknown If all of the above answers are "NO", then may proceed with Cephalosporin use.   . Celebrex [Celecoxib]   . Clindamycin/Lincomycin   . Latex   . Sulfa Antibiotics   . Strawberry Leaves Extract     Antimicrobials this admission: Aztreonam/Vanc both x 1 9/6 Levaquin  9/6 >>    Dose adjustments this admission:    Microbiology results: 9/6 BCx: pending   UCx:      Sputum:     9/6  MRSA PCR: pending  Thank you for allowing pharmacy to be a part of this patient's care.  Chinita Greenland PharmD Clinical Pharmacist 09/30/2017

## 2017-09-30 NOTE — ED Notes (Signed)
Patient transported to X-ray 

## 2017-09-30 NOTE — ED Triage Notes (Signed)
Pt arrives via [facility] after waking up SOB around 4am this morning. Pt is dyspneic 86% RA, placed on 3L nasal cannula. Protocol initiated.

## 2017-10-01 ENCOUNTER — Inpatient Hospital Stay (HOSPITAL_COMMUNITY)
Admit: 2017-10-01 | Discharge: 2017-10-01 | Disposition: A | Discharge status: Home / Self Care | Payer: Medicare Other | Attending: Internal Medicine | Admitting: Internal Medicine

## 2017-10-01 DIAGNOSIS — I5030 Unspecified diastolic (congestive) heart failure: Secondary | ICD-10-CM

## 2017-10-01 LAB — BASIC METABOLIC PANEL
Anion gap: 9 (ref 5–15)
BUN: 17 mg/dL (ref 8–23)
CALCIUM: 8.9 mg/dL (ref 8.9–10.3)
CHLORIDE: 99 mmol/L (ref 98–111)
CO2: 30 mmol/L (ref 22–32)
CREATININE: 0.81 mg/dL (ref 0.44–1.00)
GFR calc non Af Amer: >60 mL/min (ref >60)
GLUCOSE: 158 mg/dL — AB (ref 70–99)
Potassium: 4.8 mmol/L (ref 3.5–5.1)
Sodium: 138 mmol/L (ref 135–145)

## 2017-10-01 LAB — LIPID PANEL
CHOL/HDL RATIO: 4.6 ratio
Cholesterol: 170 mg/dL (ref 0–200)
HDL: 37 mg/dL — AB (ref >40)
LDL Cholesterol: 117 mg/dL — ABNORMAL HIGH (ref 0–99)
Triglycerides: 80 mg/dL (ref <150)
VLDL: 16 mg/dL (ref 0–40)

## 2017-10-01 LAB — PROTIME-INR
INR: 2.19
PROTHROMBIN TIME: 24.2 s — AB (ref 11.4–15.2)

## 2017-10-01 LAB — CBC
HCT: 34.2 % — ABNORMAL LOW (ref 35.0–47.0)
HEMOGLOBIN: 11.4 g/dL — AB (ref 12.0–16.0)
MCH: 29.7 pg (ref 26.0–34.0)
MCHC: 33.3 g/dL (ref 32.0–36.0)
MCV: 89.3 fL (ref 80.0–100.0)
Platelets: 280 10*3/uL (ref 150–440)
RBC: 3.83 MIL/uL (ref 3.80–5.20)
RDW: 14.8 % — ABNORMAL HIGH (ref 11.5–14.5)
WBC: 5.8 10*3/uL (ref 3.6–11.0)

## 2017-10-01 LAB — PROCALCITONIN

## 2017-10-01 LAB — ECHOCARDIOGRAM COMPLETE
HEIGHTINCHES: 67 in
Weight: 2480 oz

## 2017-10-01 MED ORDER — WARFARIN SODIUM 1 MG PO TABS
1.0000 mg | ORAL_TABLET | Freq: Once | ORAL | Status: AC
  Administered 2017-10-01: 17:00:00 1 mg via ORAL
  Filled 2017-10-01: qty 1

## 2017-10-01 NOTE — Progress Notes (Signed)
[facility] for Warfarin Indication: VTE prophylaxis  Hx PE/ DVT  Allergies  Allergen Reactions  . Penicillins     Has patient had a PCN reaction causing immediate rash, facial/tongue/throat swelling, SOB or lightheadedness with hypotension: Unknown Has patient had a PCN reaction causing severe rash involving mucus membranes or skin necrosis: Unknown Has patient had a PCN reaction that required hospitalization: Unknown Has patient had a PCN reaction occurring within the last 10 years: Unknown If all of the above answers are "NO", then may proceed with Cephalosporin use.   . Celebrex [Celecoxib]   . Clindamycin/Lincomycin   . Latex   . Sulfa Antibiotics   . Strawberry Leaves Extract     Patient Measurements: Height: 5\' 7"  (170.2 cm) Weight: 155 lb (70.3 kg) IBW/kg (Calculated) : 61.6 Heparin Dosing Weight:    Vital Signs: Temp: 98.4 F (36.9 C) (09/07 0404) Temp Source: Oral (09/07 0404) BP: 138/80 (09/07 0404) Pulse Rate: 73 (09/07 0404)  Labs: Recent Labs    09/30/17 0900 09/30/17 1257 09/30/17 1732 10/01/17 0425  HGB 12.4  --   --  11.4*  HCT 37.3  --   --  34.2*  PLT 272  --   --  280  LABPROT 23.0*  --   --  24.2*  INR 2.06  --   --  2.19  CREATININE 0.70  --   --  0.81  TROPONINI <0.03 0.03* <0.03  --     Estimated Creatinine Clearance: 41.3 mL/min (by C-G formula based on SCr of 0.81 mg/dL).   Medical History: Past Medical History:  Diagnosis Date  . Anxiety   . Chronic pain   . Colon cancer ([place])   . DVT (deep venous thrombosis) ([place])   . GERD (gastroesophageal reflux disease)   . Hypothyroidism   . Pulmonary embolism ([place])   . Staph aureus infection     Medications:  Scheduled:  . calcium-vitamin D  1 tablet Oral Daily  . carbamide peroxide  5 drop Left EAR BID  . citalopram  40 mg Oral Daily  . doxycycline  100 mg Oral Q12H  . ferrous sulfate  325 mg Oral Daily  . furosemide  40 mg Intravenous Daily   . gabapentin  100 mg Oral TID  . levothyroxine  112 mcg Oral BH-q7a  . magnesium oxide  400 mg Oral Daily  . metoprolol tartrate  25 mg Oral BID  . olopatadine  1 drop Both Eyes q morning - 10a  . oxyCODONE  10 mg Oral Q12H  . pantoprazole  40 mg Oral Daily  . polyethylene glycol  17 g Oral Daily  . sodium chloride flush  3 mL Intravenous Q12H  . Warfarin - Pharmacist Dosing Inpatient   Does not apply q1800   Infusions:  . levofloxacin (LEVAQUIN) IV      Assessment: 84 yo F on Warfarin PTA for hx of DVT/PE. Per MD consult- hold Warfarin today 09/30/17 d/t patient with large hematoma on hand. Home Warfarin:   2 mg Mon, Wed, Fri, Sun         4 mg  Harrell Lark, Sat  9/6  INR  2.06  Dose Held 9/7  INR  2.19  Goal of Therapy:  INR 2-3 Monitor platelets by anticoagulation protocol: Yes   Plan:  Per MD request- Will hold Warfarin today 09/30/17 d/t patient with large hematoma on hand and starting Levaquin. Also, Patient on doxycyline PTA which has been continued.  Will f/u INR in am and assess for further dosing  9/7 INR therapeutic. Will order conservative dose of Warfarin 1 mg x 1 tonight given pt on abx with hematoma. F/u INR in am.  Kassondra Geil A 10/01/2017,10:13 AM

## 2017-10-01 NOTE — Progress Notes (Addendum)
Reports no issues/concerns except want to get off 02. Unable to produce sputum specimen for collection- cup at bedside. MD turned 02 down to 1l/[place] but increased back to 2l/[place] with pulse ox 92 % at rest. Denies SOB/dysnea. Family in. Levaquin IV continued. Ice pack to hematoma on left forearm this morning; MD modified order to prn. To restart coumadin this p.m.

## 2017-10-01 NOTE — Progress Notes (Signed)
[facility] [place] NAME: Audrey Wheeler    MR#:  [id number]  DATE OF BIRTH:  Jan 09, 1921  SUBJECTIVE:  CHIEF COMPLAINT:   Chief Complaint  Patient presents with  . Shortness of Breath   -Still with some dyspnea, currently needing 2 L oxygen which is acute.  REVIEW OF SYSTEMS:  Review of Systems  Constitutional: Negative for chills, fever and malaise/fatigue.  HENT: Negative for ear discharge, hearing loss and nosebleeds.   Eyes: Negative for blurred vision and double vision.  Respiratory: Positive for shortness of breath. Negative for cough and wheezing.   Cardiovascular: Negative for chest pain, palpitations and leg swelling.  Gastrointestinal: Negative for abdominal pain, constipation, diarrhea, nausea and vomiting.  Genitourinary: Negative for dysuria.  Musculoskeletal: Positive for myalgias.  Neurological: Negative for dizziness, focal weakness, seizures, weakness and headaches.  Psychiatric/Behavioral: Negative for depression.    DRUG ALLERGIES:   Allergies  Allergen Reactions  . Penicillins     Has patient had a PCN reaction causing immediate rash, facial/tongue/throat swelling, SOB or lightheadedness with hypotension: Unknown Has patient had a PCN reaction causing severe rash involving mucus membranes or skin necrosis: Unknown Has patient had a PCN reaction that required hospitalization: Unknown Has patient had a PCN reaction occurring within the last 10 years: Unknown If all of the above answers are "NO", then may proceed with Cephalosporin use.   . Celebrex [Celecoxib]   . Clindamycin/Lincomycin   . Latex   . Sulfa Antibiotics   . Strawberry Leaves Extract     VITALS:  Blood pressure 136/66, pulse 78, temperature 97.9 F (36.6 C), temperature source Oral, resp. rate 20, height 5\' 7"  (1.702 m), weight 70.3 kg, SpO2 92 %.  PHYSICAL EXAMINATION:  Physical Exam  GENERAL:  84 y.o.-year-old elderly patient lying in the  bed with no acute distress.  EYES: Pupils equal, round, reactive to light and accommodation. No scleral icterus. Extraocular muscles intact.  HEENT: Head atraumatic, normocephalic. Oropharynx and nasopharynx clear.  NECK:  Supple, no jugular venous distention. No thyroid enlargement, no tenderness.  LUNGS: Normal breath sounds bilaterally, no wheezing.  Bibasilar crackles heard, no use of accessory muscles of respiration.  CARDIOVASCULAR: S1, S2 normal. No rubs, or gallops. 3/6 systolic murmur is present ABDOMEN: Soft, nontender, nondistended. Bowel sounds present. No organomegaly or mass.  EXTREMITIES: No pedal edema, cyanosis, or clubbing.  NEUROLOGIC: Cranial nerves II through XII are intact. Muscle strength 5/5 in all extremities. Sensation intact. Gait not checked.  Global weakness noted PSYCHIATRIC: The patient is alert and oriented x 3.  SKIN: No obvious rash, lesion, or ulcer.    LABORATORY PANEL:   CBC Recent Labs  Lab 10/01/17 0425  WBC 5.8  HGB 11.4*  HCT 34.2*  PLT 280   ------------------------------------------------------------------------------------------------------------------  Chemistries  Recent Labs  Lab 10/01/17 0425  NA 138  K 4.8  CL 99  CO2 30  GLUCOSE 158*  BUN 17  CREATININE 0.81  CALCIUM 8.9   ------------------------------------------------------------------------------------------------------------------  Cardiac Enzymes Recent Labs  Lab 09/30/17 1732  TROPONINI <0.03   ------------------------------------------------------------------------------------------------------------------  RADIOLOGY:  Dg Chest 2 View  Result Date: 09/30/2017 CLINICAL DATA:  Shortness of breath EXAM: CHEST - 2 VIEW COMPARISON:  None. FINDINGS: There is patchy airspace consolidation in the left base with small left pleural effusion. There is mild atelectatic change in the right base. There is mild cardiac enlargement with mild pulmonary venous hypertension. No  adenopathy. There is aortic atherosclerosis. There  is postoperative change in the right shoulder. IMPRESSION: Airspace consolidation posterior left base, likely due to pneumonia. Small left pleural effusion. Atelectatic change right base. Pulmonary vascular congestion without frank edema. Aortic atherosclerosis. Aortic Atherosclerosis (ICD10-I70.0). Electronically Signed   By: Lowella Grip III M.D.   On: 09/30/2017 09:47    EKG:   Orders placed or performed during the hospital encounter of 09/30/17  . EKG 12-Lead  . EKG 12-Lead    ASSESSMENT AND PLAN:   84 year old female with past medical history significant for history of DVT and PE, right knee staph infection on chronic doxycycline, anxiety and history of colon cancer presents to hospital secondary to worsening shortness of breath.  1.  Acute pulmonary edema-congestive heart failure, echocardiogram is pending -Continue IV Lasix -On 2 L oxygen, wean as tolerated.  Strict input and output monitoring. -Encouraged to do incentive spirometry  2.  Left lower lobe pneumonia-community-acquired -Continue Levaquin for now.  3.  History of DVT and PE-on Coumadin.  INR is therapeutic.  Restarted Coumadin today. -Traumatic left wrist hematoma after blood draw-resolving.  Continue ice pack as needed  4.  History of chronic right knee staph infection-continue suppressive therapy with doxycycline  5.  Hypothyroidism-Synthroid  6.  DVT prophylaxis-already on warfarin   Physical Therapy consulted All the records are reviewed and case discussed with Care Management/Social Workerr. Management plans discussed with the patient, family and they are in agreement.  CODE STATUS: DNR  TOTAL TIME TAKING CARE OF THIS PATIENT: 38 minutes.   POSSIBLE D/C IN 2 DAYS, DEPENDING ON CLINICAL CONDITION.   Gladstone Lighter M.D on 10/01/2017 at 12:10 PM  Between 7am to 6pm - Pager - [phone]  After 6pm go to [email or web address] - password [email or web address]  [facility]  Office  [phone]  CC: Primary care physician; Patient, No Pcp Per

## 2017-10-01 NOTE — Plan of Care (Signed)
No complaints of shortness of breath.  Oxygen saturation low 90s on 3LNC.

## 2017-10-01 NOTE — Clinical Social Work Note (Signed)
CSW contaced [facility] ad was informed that they do not have a SNF side to their facility. CSW will discuss with the patient and her family when able to assess for possible SNF in [place] at her independent living facility.  Santiago Bumpers, MSW, Latanya Presser [phone]

## 2017-10-02 ENCOUNTER — Inpatient Hospital Stay: Payer: Medicare Other

## 2017-10-02 LAB — PROTIME-INR
INR: 2.15
PROTHROMBIN TIME: 23.8 s — AB (ref 11.4–15.2)

## 2017-10-02 LAB — BASIC METABOLIC PANEL
ANION GAP: 10 (ref 5–15)
BUN: 17 mg/dL (ref 8–23)
CALCIUM: 8.7 mg/dL — AB (ref 8.9–10.3)
CO2: 31 mmol/L (ref 22–32)
CREATININE: 0.81 mg/dL (ref 0.44–1.00)
Chloride: 99 mmol/L (ref 98–111)
Glucose, Bld: 93 mg/dL (ref 70–99)
Potassium: 3.4 mmol/L — ABNORMAL LOW (ref 3.5–5.1)
Sodium: 140 mmol/L (ref 135–145)

## 2017-10-02 LAB — PROCALCITONIN

## 2017-10-02 MED ORDER — WARFARIN SODIUM 2 MG PO TABS
2.0000 mg | ORAL_TABLET | Freq: Once | ORAL | Status: DC
  Filled 2017-10-02: qty 1

## 2017-10-02 MED ORDER — FUROSEMIDE 40 MG PO TABS
40.0000 mg | ORAL_TABLET | Freq: Every day | ORAL | Status: DC
  Administered 2017-10-03: 10:00:00 40 mg via ORAL
  Filled 2017-10-02: qty 1

## 2017-10-02 MED ORDER — POTASSIUM CHLORIDE CRYS ER 20 MEQ PO TBCR
20.0000 meq | EXTENDED_RELEASE_TABLET | Freq: Every day | ORAL | Status: AC
  Administered 2017-10-02 – 2017-10-03 (×2): 20 meq via ORAL
  Filled 2017-10-02 (×2): qty 1

## 2017-10-02 MED ORDER — ACETAMINOPHEN 500 MG PO TABS
1000.0000 mg | ORAL_TABLET | Freq: Three times a day (TID) | ORAL | Status: DC
  Administered 2017-10-02 – 2017-10-03 (×5): 1000 mg via ORAL
  Filled 2017-10-02 (×5): qty 2

## 2017-10-02 NOTE — Progress Notes (Signed)
[facility] [place] NAME: Audrey Wheeler    MR#:  [id number]  DATE OF BIRTH:  1920/07/24  SUBJECTIVE:  CHIEF COMPLAINT:   Chief Complaint  Patient presents with  . Shortness of Breath   -Slowly improving.  Still requiring 1 L oxygen via nasal cannula -Daughter at bedside and updated  REVIEW OF SYSTEMS:  Review of Systems  Constitutional: Negative for chills, fever and malaise/fatigue.  HENT: Negative for ear discharge, hearing loss and nosebleeds.   Eyes: Negative for blurred vision and double vision.  Respiratory: Positive for shortness of breath. Negative for cough and wheezing.   Cardiovascular: Negative for chest pain, palpitations and leg swelling.  Gastrointestinal: Negative for abdominal pain, constipation, diarrhea, nausea and vomiting.  Genitourinary: Negative for dysuria.  Musculoskeletal: Positive for myalgias.  Neurological: Negative for dizziness, focal weakness, seizures, weakness and headaches.  Psychiatric/Behavioral: Negative for depression.    DRUG ALLERGIES:   Allergies  Allergen Reactions  . Penicillins     Has patient had a PCN reaction causing immediate rash, facial/tongue/throat swelling, SOB or lightheadedness with hypotension: Unknown Has patient had a PCN reaction causing severe rash involving mucus membranes or skin necrosis: Unknown Has patient had a PCN reaction that required hospitalization: Unknown Has patient had a PCN reaction occurring within the last 10 years: Unknown If all of the above answers are "NO", then may proceed with Cephalosporin use.   . Celebrex [Celecoxib]   . Clindamycin/Lincomycin   . Latex   . Sulfa Antibiotics   . Strawberry Leaves Extract     VITALS:  Blood pressure 118/72, pulse 70, temperature 98.9 F (37.2 C), temperature source Oral, resp. rate 20, height 5\' 7"  (1.702 m), weight 69.8 kg, SpO2 92 %.  PHYSICAL EXAMINATION:  Physical Exam  GENERAL:  84 y.o.-year-old  elderly patient lying in the bed with no acute distress.  EYES: Pupils equal, round, reactive to light and accommodation. No scleral icterus. Extraocular muscles intact.  HEENT: Head atraumatic, normocephalic. Oropharynx and nasopharynx clear.  NECK:  Supple, no jugular venous distention. No thyroid enlargement, no tenderness.  LUNGS: Normal breath sounds bilaterally, no wheezing.  Improved bibasilar crackles, no use of accessory muscles of respiration.  CARDIOVASCULAR: S1, S2 normal. No rubs, or gallops. 3/6 systolic murmur is present ABDOMEN: Soft, nontender, nondistended. Bowel sounds present. No organomegaly or mass.  EXTREMITIES: No pedal edema, cyanosis, or clubbing.  NEUROLOGIC: Cranial nerves II through XII are intact. Muscle strength 5/5 in all extremities. Sensation intact. Gait not checked.  Global weakness noted PSYCHIATRIC: The patient is alert and oriented x 3.  SKIN: No obvious rash, lesion, or ulcer.    LABORATORY PANEL:   CBC Recent Labs  Lab 10/01/17 0425  WBC 5.8  HGB 11.4*  HCT 34.2*  PLT 280   ------------------------------------------------------------------------------------------------------------------  Chemistries  Recent Labs  Lab 10/02/17 0339  NA 140  K 3.4*  CL 99  CO2 31  GLUCOSE 93  BUN 17  CREATININE 0.81  CALCIUM 8.7*   ------------------------------------------------------------------------------------------------------------------  Cardiac Enzymes Recent Labs  Lab 09/30/17 1732  TROPONINI <0.03   ------------------------------------------------------------------------------------------------------------------  RADIOLOGY:  Dg Chest 2 View  Result Date: 10/02/2017 CLINICAL DATA:  Shortness of breath EXAM: CHEST - 2 VIEW COMPARISON:  September 30, 2017 FINDINGS: Mild cardiomegaly. The hila and mediastinum are normal. Small effusion and opacity in left base is stable. No overt edema. IMPRESSION: Stable small left effusion with  associated opacity. No other change. No overt edema.  Electronically Signed   By: Dorise Bullion III M.D   On: 10/02/2017 09:44    EKG:   Orders placed or performed during the hospital encounter of 09/30/17  . EKG 12-Lead  . EKG 12-Lead    ASSESSMENT AND PLAN:   84 year old female with past medical history significant for history of DVT and PE, right knee staph infection on chronic doxycycline, anxiety and history of colon cancer presents to hospital secondary to worsening shortness of breath.  1.  Acute pulmonary edema-congestive heart failure,  -Acute diastolic dysfunction.  Echocardiogram showing normal EF of 60 to 65%, however has significantly elevated right-sided heart pressures, has severe pulmonary hypertension. -Change Lasix to oral daily, outpatient pulmonary follow-up after discharge. -May be low-dose oral Lasix at discharge as needed. -On 1-2 L oxygen, wean as tolerated.  Strict input and output monitoring. -Encouraged to do incentive spirometry  2.  Left lower lobe pneumonia-community-acquired -Continue Levaquin for now.  Treat for 7 days and stop. -Repeat chest x-ray with significant improvement  3.  History of DVT and PE-on Coumadin.  INR is therapeutic.  On Coumadin. -Traumatic left wrist hematoma after blood draw on admission-resolving.  Continue ice pack as needed  4.  History of chronic right knee staph infection-continue suppressive therapy with doxycycline  5.  Hypothyroidism-Synthroid  6.  DVT prophylaxis-on warfarin   Physical Therapy consulted-they have recommended rehab, but patient is getting stronger and would like to go home with home health    All the records are reviewed and case discussed with Care Management/Social Workerr. Management plans discussed with the patient, family and they are in agreement.  CODE STATUS: DNR  TOTAL TIME TAKING CARE OF THIS PATIENT: 38 minutes.   POSSIBLE D/C IN 2 DAYS, DEPENDING ON CLINICAL  CONDITION.   Gladstone Lighter M.D on 10/02/2017 at 12:34 PM  Between 7am to 6pm - Pager - [phone]  After 6pm go to [email or web address] - password [email or web address] [facility]  Office  [phone]  CC: Primary care physician; Patient, No Pcp Per

## 2017-10-02 NOTE — Progress Notes (Signed)
Pt up to chair and tolerated well with walker/1+ walking around FOB. 02 weaned with pt ranging from 88-93 at rest with 1l/[place]. MD notified and agrees to hold at present level. K+ 3.4 with po supplement given. Family in. Denies co's/needs.

## 2017-10-02 NOTE — Progress Notes (Signed)
[facility] for Warfarin Indication: VTE prophylaxis  Hx PE/ DVT  Allergies  Allergen Reactions  . Penicillins     Has patient had a PCN reaction causing immediate rash, facial/tongue/throat swelling, SOB or lightheadedness with hypotension: Unknown Has patient had a PCN reaction causing severe rash involving mucus membranes or skin necrosis: Unknown Has patient had a PCN reaction that required hospitalization: Unknown Has patient had a PCN reaction occurring within the last 10 years: Unknown If all of the above answers are "NO", then may proceed with Cephalosporin use.   . Celebrex [Celecoxib]   . Clindamycin/Lincomycin   . Latex   . Sulfa Antibiotics   . Strawberry Leaves Extract     Patient Measurements: Height: 5\' 7"  (170.2 cm) Weight: 153 lb 14.1 oz (69.8 kg) IBW/kg (Calculated) : 61.6 Heparin Dosing Weight:    Vital Signs: Temp: 98.9 F (37.2 C) (09/08 0847) Temp Source: Oral (09/08 0847) BP: 118/72 (09/08 0847) Pulse Rate: 70 (09/08 0847)  Labs: Recent Labs    09/30/17 0900 09/30/17 1257 09/30/17 1732 10/01/17 0425 10/02/17 0339  HGB 12.4  --   --  11.4*  --   HCT 37.3  --   --  34.2*  --   PLT 272  --   --  280  --   LABPROT 23.0*  --   --  24.2* 23.8*  INR 2.06  --   --  2.19 2.15  CREATININE 0.70  --   --  0.81 0.81  TROPONINI <0.03 0.03* <0.03  --   --     Estimated Creatinine Clearance: 41.3 mL/min (by C-G formula based on SCr of 0.81 mg/dL).   Medical History: Past Medical History:  Diagnosis Date  . Anxiety   . Chronic pain   . Colon cancer ([place])   . DVT (deep venous thrombosis) ([place])   . GERD (gastroesophageal reflux disease)   . Hypothyroidism   . Pulmonary embolism ([place])   . Staph aureus infection     Medications:  Scheduled:  . acetaminophen  1,000 mg Oral TID  . calcium-vitamin D  1 tablet Oral Daily  . carbamide peroxide  5 drop Left EAR BID  . citalopram  40 mg Oral Daily  . doxycycline   100 mg Oral Q12H  . ferrous sulfate  325 mg Oral Daily  . furosemide  40 mg Intravenous Daily  . gabapentin  100 mg Oral TID  . levothyroxine  112 mcg Oral BH-q7a  . magnesium oxide  400 mg Oral Daily  . metoprolol tartrate  25 mg Oral BID  . olopatadine  1 drop Both Eyes q morning - 10a  . oxyCODONE  10 mg Oral Q12H  . pantoprazole  40 mg Oral Daily  . polyethylene glycol  17 g Oral Daily  . potassium chloride  20 mEq Oral Daily  . sodium chloride flush  3 mL Intravenous Q12H  . Warfarin - Pharmacist Dosing Inpatient   Does not apply q1800   Infusions:  . levofloxacin (LEVAQUIN) IV 50 mL/hr at 10/01/17 1205    Assessment: 84 yo F on Warfarin PTA for hx of DVT/PE. Per MD consult- hold Warfarin today 09/30/17 d/t patient with large hematoma on hand. Home Warfarin:   2 mg Mon, Wed, Fri, Sun         4 mg  Harrell Lark, Sat  9/6  INR  2.06  Dose Held 9/7  INR  2.19 1 mg 9/8  INR  2.15  Goal of Therapy:  INR 2-3 Monitor platelets by anticoagulation protocol: Yes   Plan:  Per MD request- Will hold Warfarin today 09/30/17 d/t patient with large hematoma on hand and starting Levaquin. Also, Patient on doxycyline PTA which has been continued. Will f/u INR in am and assess for further dosing  9/7 INR therapeutic. Will order conservative dose of Warfarin 1 mg x 1 tonight given pt on abx with hematoma. F/u INR in am.  9/8 INR therapeutic. Hematoma resolving per MD note. Will order 2 mg x 1 today. F/u INR in am  Quynh Basso A 10/02/2017,8:50 AM

## 2017-10-03 LAB — BASIC METABOLIC PANEL
Anion gap: 5 (ref 5–15)
BUN: 22 mg/dL (ref 8–23)
CALCIUM: 8.4 mg/dL — AB (ref 8.9–10.3)
CO2: 32 mmol/L (ref 22–32)
CREATININE: 0.96 mg/dL (ref 0.44–1.00)
Chloride: 99 mmol/L (ref 98–111)
GFR calc Af Amer: 57 mL/min — ABNORMAL LOW (ref >60)
GFR calc non Af Amer: 49 mL/min — ABNORMAL LOW (ref >60)
GLUCOSE: 94 mg/dL (ref 70–99)
Potassium: 4.5 mmol/L (ref 3.5–5.1)
Sodium: 136 mmol/L (ref 135–145)

## 2017-10-03 LAB — PROTIME-INR
INR: 1.83
PROTHROMBIN TIME: 21 s — AB (ref 11.4–15.2)

## 2017-10-03 MED ORDER — IPRATROPIUM-ALBUTEROL 0.5-2.5 (3) MG/3ML IN SOLN: 3.0000 mL | RESPIRATORY_TRACT | Status: DC | PRN

## 2017-10-03 MED ORDER — LEVOFLOXACIN 250 MG PO TABS: 250.0000 mg | ORAL_TABLET | Freq: Every day | ORAL | 0 refills | Status: AC

## 2017-10-03 MED ORDER — WARFARIN SODIUM 4 MG PO TABS
4.0000 mg | ORAL_TABLET | Freq: Once | ORAL | Status: AC
  Administered 2017-10-03: 4 mg via ORAL
  Filled 2017-10-03: qty 1
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
## 2017-10-03 NOTE — Care Management Important Message (Signed)
Important Message  Patient Details  Name: Audrey Wheeler MRN: [id number] Date of Birth: May 07, 1920   Medicare Important Message Given:  Yes    [name] A [name] 10/03/2017, 10:39 AM

## 2017-10-03 NOTE — Care Management (Signed)
Patient meets qualifications for home O2. She lives in [place] at an [facility] and her daughter will be transferring her back today. New O2 ordered from [place] with [facility]. He will delivered today to room for transport home.

## 2017-10-03 NOTE — Progress Notes (Signed)
Pharmacy Antibiotic Note  Audrey Wheeler is a 84 y.o. female admitted on 09/30/2017 with pneumonia.  Pharmacy has been consulted for Levaquin dosing.   Plan: Continue Levaquin 250 mg IV q24h.   Note: per H&P patient with chronic Staph infection of R knee- on doxycyline   Height: 5\' 7"  (170.2 cm) Weight: 155 lb 12.8 oz (70.7 kg) IBW/kg (Calculated) : 61.6  Temp (24hrs), Avg:98 F (36.7 C), Min:97.6 F (36.4 C), Max:98.3 F (36.8 C)  Recent Labs  Lab 09/30/17 0900 10/01/17 0425 10/02/17 0339 10/03/17 0410  WBC 6.2 5.8  --   --   CREATININE 0.70 0.81 0.81 0.96    Estimated Creatinine Clearance: 34.8 mL/min (by C-G formula based on SCr of 0.96 mg/dL).    Allergies  Allergen Reactions  . Penicillins     Has patient had a PCN reaction causing immediate rash, facial/tongue/throat swelling, SOB or lightheadedness with hypotension: Unknown Has patient had a PCN reaction causing severe rash involving mucus membranes or skin necrosis: Unknown Has patient had a PCN reaction that required hospitalization: Unknown Has patient had a PCN reaction occurring within the last 10 years: Unknown If all of the above answers are "NO", then may proceed with Cephalosporin use.   . Celebrex [Celecoxib]   . Clindamycin/Lincomycin   . Latex   . Sulfa Antibiotics   . Strawberry Leaves Extract     Antimicrobials this admission: Aztreonam/Vanc both x 1 9/6 Levaquin  9/6 >>    Dose adjustments this admission:    Microbiology results: 9/6 BCx: NG TD  9/6  MRSA PCR: NG TD  Thank you for allowing pharmacy to be a part of this patient's care.  Pernell Dupre, PharmD, BCPS Clinical Pharmacist 10/03/2017 8:55 AM

## 2017-10-03 NOTE — Progress Notes (Signed)
[facility] for Warfarin Indication: VTE prophylaxis  Hx PE/ DVT  Allergies  Allergen Reactions  . Penicillins     Has patient had a PCN reaction causing immediate rash, facial/tongue/throat swelling, SOB or lightheadedness with hypotension: Unknown Has patient had a PCN reaction causing severe rash involving mucus membranes or skin necrosis: Unknown Has patient had a PCN reaction that required hospitalization: Unknown Has patient had a PCN reaction occurring within the last 10 years: Unknown If all of the above answers are "NO", then may proceed with Cephalosporin use.   . Celebrex [Celecoxib]   . Clindamycin/Lincomycin   . Latex   . Sulfa Antibiotics   . Strawberry Leaves Extract     Patient Measurements: Height: 5\' 7"  (170.2 cm) Weight: 155 lb 12.8 oz (70.7 kg) IBW/kg (Calculated) : 61.6 Heparin Dosing Weight:    Vital Signs: Temp: 98.3 F (36.8 C) (09/09 0402) Temp Source: Oral (09/09 0402) BP: 120/56 (09/09 0402) Pulse Rate: 64 (09/09 0402)  Labs: Recent Labs    09/30/17 0900 09/30/17 1257 09/30/17 1732 10/01/17 0425 10/02/17 0339 10/03/17 0410  HGB 12.4  --   --  11.4*  --   --   HCT 37.3  --   --  34.2*  --   --   PLT 272  --   --  280  --   --   LABPROT 23.0*  --   --  24.2* 23.8* 21.0*  INR 2.06  --   --  2.19 2.15 1.83  CREATININE 0.70  --   --  0.81 0.81 0.96  TROPONINI <0.03 0.03* <0.03  --   --   --     Estimated Creatinine Clearance: 34.8 mL/min (by C-G formula based on SCr of 0.96 mg/dL).   Medical History: Past Medical History:  Diagnosis Date  . Anxiety   . Chronic pain   . Colon cancer ([place])   . DVT (deep venous thrombosis) ([place])   . GERD (gastroesophageal reflux disease)   . Hypothyroidism   . Pulmonary embolism ([place])   . Staph aureus infection     Medications:  Scheduled:  . acetaminophen  1,000 mg Oral TID  . calcium-vitamin D  1 tablet Oral Daily  . carbamide peroxide  5 drop Left EAR BID   . citalopram  40 mg Oral Daily  . doxycycline  100 mg Oral Q12H  . ferrous sulfate  325 mg Oral Daily  . furosemide  40 mg Oral Daily  . gabapentin  100 mg Oral TID  . levothyroxine  112 mcg Oral BH-q7a  . magnesium oxide  400 mg Oral Daily  . metoprolol tartrate  25 mg Oral BID  . olopatadine  1 drop Both Eyes q morning - 10a  . oxyCODONE  10 mg Oral Q12H  . pantoprazole  40 mg Oral Daily  . polyethylene glycol  17 g Oral Daily  . potassium chloride  20 mEq Oral Daily  . sodium chloride flush  3 mL Intravenous Q12H  . warfarin  2 mg Oral ONCE-1800  . Warfarin - Pharmacist Dosing Inpatient   Does not apply q1800   Infusions:  . levofloxacin (LEVAQUIN) IV Stopped (10/02/17 0901)    Assessment: Pharmacy consulted for warfarin dosing and monitoring in 84 yo F on Warfarin PTA for hx of DVT/PE.  Per MD consult- Warfarin held 09/30/17 d/t patient with large hematoma on hand and starting on Abx.  Home Warfarin Regimen :   2  mg Mon, Wed, Fri, Sun           4 mg  Harrell Lark, Sat  DATE INR DOSE 9/6 2.06   Dose Held 9/7 2.19 Warfarin 1 mg 9/8 2.15  Dose not given, reason not documented 9/9 1.83  Goal of Therapy:  INR 2-3 Monitor platelets by anticoagulation protocol: Yes   Plan:  9/9 INR subtherapeutic this morning. Warfarin dose never documented as given last night, no reason was noted to why dose was missed. Will order Warfarin 4mg  x 1 today. F/u INR in am  Pernell Dupre, PharmD, BCPS Clinical Pharmacist 10/03/2017 8:31 AM

## 2017-10-03 NOTE — Progress Notes (Signed)
Received MD to discharge patient to home, reviewed home meds, prescriptions and discharge instructions with patient and daughter and both verbalized understanding.

## 2017-10-03 NOTE — Progress Notes (Signed)
SATURATION QUALIFICATIONS: (This note is used to comply with regulatory documentation for home oxygen)  Patient Saturations on Room Air at Rest = 89%  Patient Saturations on Room Air while Ambulating = 83%  Patient Saturations on 2 Liters of oxygen while Ambulating = 92%  Please briefly explain why patient needs home oxygen: pulmonary HTN

## 2017-10-03 NOTE — Discharge Summary (Signed)
[facility] [place] NAME: Audrey Wheeler    MR#:  [id number]  DATE OF BIRTH:  1920/04/17  DATE OF ADMISSION:  09/30/2017 ADMITTING PHYSICIAN: Loletha Grayer, MD  DATE OF DISCHARGE: 10/03/2017  PRIMARY CARE PHYSICIAN: Patient, No Pcp Per    ADMISSION DIAGNOSIS:  Community acquired pneumonia of left lower lobe of lung ([place]) [J18.1]  DISCHARGE DIAGNOSIS:  Active Problems:   Pneumonia   SECONDARY DIAGNOSIS:   Past Medical History:  Diagnosis Date  . Anxiety   . Chronic pain   . Colon cancer ([place])   . DVT (deep venous thrombosis) ([place])   . GERD (gastroesophageal reflux disease)   . Hypothyroidism   . Pulmonary embolism ([place])   . Staph aureus infection     HOSPITAL COURSE:  84 year old female with a history of DVT and PE, chronic diastolic heart failure with preserved ejection fraction who presents with shortness of breath  1.  Acute hypoxic respiratory failure in the setting of community-acquired pneumonia and acute on chronic diastolic heart failure  2.  Community-acquired pneumonia: Patient will be discharged on Levaquin  3.  Acute on chronic diastolic heart failure with preserved ejection fraction: Patient was on IV Lasix transition to oral Lasix.  She takes Lasix at home which she will continue.  She will have follow-up with her outpatient primary care physician in [facility].    4.  History of DVT and PE: Patient will continue on Coumadin.  INR needs to be checked daily.  5.  History of chronic right knee staph infection: Patient is on doxycycline  6.  Hypothyroid: Continue Synthroid DISCHARGE CONDITIONS AND DIET:   Stable for discharge  Heart healthy diet CONSULTS OBTAINED:    DRUG ALLERGIES:   Allergies  Allergen Reactions  . Penicillins     Has patient had a PCN reaction causing immediate rash, facial/tongue/throat swelling, SOB or lightheadedness with hypotension: Unknown Has patient had a PCN  reaction causing severe rash involving mucus membranes or skin necrosis: Unknown Has patient had a PCN reaction that required hospitalization: Unknown Has patient had a PCN reaction occurring within the last 10 years: Unknown If all of the above answers are "NO", then may proceed with Cephalosporin use.   . Celebrex [Celecoxib]   . Clindamycin/Lincomycin   . Latex   . Sulfa Antibiotics   . Strawberry Leaves Extract     DISCHARGE MEDICATIONS:   Allergies as of 10/03/2017      Reactions   Penicillins    Has patient had a PCN reaction causing immediate rash, facial/tongue/throat swelling, SOB or lightheadedness with hypotension: Unknown Has patient had a PCN reaction causing severe rash involving mucus membranes or skin necrosis: Unknown Has patient had a PCN reaction that required hospitalization: Unknown Has patient had a PCN reaction occurring within the last 10 years: Unknown If all of the above answers are "NO", then may proceed with Cephalosporin use.   Celebrex [celecoxib]    Clindamycin/lincomycin    Latex    Sulfa Antibiotics    Strawberry Leaves Extract       Medication List    TAKE these medications   acetaminophen 500 MG tablet Commonly known as:  TYLENOL Take 1,000 mg by mouth 3 (three) times daily.   Calcium 600-200 MG-UNIT tablet Take 1 tablet by mouth daily.   citalopram 40 MG tablet Commonly known as:  CELEXA Take 40 mg by mouth daily.   dexlansoprazole 60 MG capsule Commonly known as:  DEXILANT Take 60 mg by mouth daily.   diclofenac sodium 1 % Gel Commonly known as:  VOLTAREN Apply topically 4 (four) times daily as needed for pain.   doxycycline 100 MG capsule Commonly known as:  VIBRAMYCIN Take 100 mg by mouth 2 (two) times daily.   ferrous sulfate 325 (65 FE) MG tablet Take 325 mg by mouth daily.   furosemide 40 MG tablet Commonly known as:  LASIX Take 40 mg by mouth daily.   gabapentin 100 MG capsule Commonly known as:  NEURONTIN Take  100 mg by mouth 3 (three) times daily.   levofloxacin 250 MG tablet Commonly known as:  LEVAQUIN Take 1 tablet (250 mg total) by mouth daily for 3 days.   levothyroxine 112 MCG tablet Commonly known as:  SYNTHROID, LEVOTHROID Take 112 mcg by mouth daily.   magnesium oxide 400 MG tablet Commonly known as:  MAG-OX Take 400 mg by mouth daily.   metoprolol tartrate 25 MG tablet Commonly known as:  LOPRESSOR Take 25 mg by mouth 2 (two) times daily.   nitroGLYCERIN 0.4 MG SL tablet Commonly known as:  NITROSTAT Place 0.4 mg under the tongue as needed for chest pain.   Olopatadine HCl 0.2 % Soln Place 1 drop into both eyes daily.   OXYCONTIN 10 mg 12 hr tablet Generic drug:  oxyCODONE Take 10 mg by mouth every 12 (twelve) hours.   polyethylene glycol packet Commonly known as:  MIRALAX / GLYCOLAX Take 17 g by mouth 2 (two) times daily.   REFRESH OP Place 1 drop into both eyes as needed (Dry eyes).   warfarin 4 MG tablet Commonly known as:  COUMADIN Take 2-4 mg by mouth daily. Take 2 mg by mouth on Monday, Wednesday, Friday and Sunday. Take 4 mg by mouth on all other days.            Durable Medical Equipment  (From admission, onward)         Start     Ordered   10/03/17 1104  DME Oxygen  Once    Question Answer Comment  Mode or (Route) Nasal cannula   Liters per Minute 2   Frequency Continuous (stationary and portable oxygen unit needed)   Oxygen conserving device Yes   Oxygen delivery system Gas      09 /09/19 1103            Today   CHIEF COMPLAINT:  Patient and family ready to take patient back home   VITAL SIGNS:  Blood pressure 113/63, pulse 64, temperature 98.6 F (37 C), temperature source Oral, resp. rate 17, height 5\' 7"  (1.702 m), weight 70.7 kg, SpO2 92 %.   REVIEW OF SYSTEMS:  Review of Systems  Constitutional: Negative.  Negative for chills, fever and malaise/fatigue.  HENT: Negative.  Negative for ear discharge, ear pain, hearing  loss, nosebleeds and sore throat.   Eyes: Negative.  Negative for blurred vision and pain.  Respiratory: Negative.  Negative for cough, hemoptysis, shortness of breath and wheezing.   Cardiovascular: Negative.  Negative for chest pain, palpitations and leg swelling.  Gastrointestinal: Negative.  Negative for abdominal pain, blood in stool, diarrhea, nausea and vomiting.  Genitourinary: Negative.  Negative for dysuria.  Musculoskeletal: Negative.  Negative for back pain.  Skin: Negative.   Neurological: Negative for dizziness, tremors, speech change, focal weakness, seizures and headaches.  Endo/Heme/Allergies: Negative.  Does not bruise/bleed easily.  Psychiatric/Behavioral: Negative.  Negative for depression, hallucinations and suicidal ideas.  PHYSICAL EXAMINATION:  GENERAL:  84 y.o.-year-old patient lying in the bed with no acute distress.  NECK:  Supple, no jugular venous distention. No thyroid enlargement, no tenderness.  LUNGS: Normal breath sounds bilaterally, no wheezing, rales,rhonchi  No use of accessory muscles of respiration.  CARDIOVASCULAR: S1, S2 normal. No murmurs, rubs, or gallops.  ABDOMEN: Soft, non-tender, non-distended. Bowel sounds present. No organomegaly or mass.  EXTREMITIES: No pedal edema, cyanosis, or clubbing.  PSYCHIATRIC: The patient is alert and oriented x 3.  SKIN: No obvious rash, lesion, or ulcer.   DATA REVIEW:   CBC Recent Labs  Lab 10/01/17 0425  WBC 5.8  HGB 11.4*  HCT 34.2*  PLT 280    Chemistries  Recent Labs  Lab 10/03/17 0410  NA 136  K 4.5  CL 99  CO2 32  GLUCOSE 94  BUN 22  CREATININE 0.96  CALCIUM 8.4*    Cardiac Enzymes Recent Labs  Lab 09/30/17 0900 09/30/17 1257 09/30/17 1732  TROPONINI <0.03 0.03* <0.03    Microbiology Results  @[email or web address] @  RADIOLOGY:  Dg Chest 2 View  Result Date: 10/02/2017 CLINICAL DATA:  Shortness of breath EXAM: CHEST - 2 VIEW COMPARISON:  September 30, 2017 FINDINGS: Mild  cardiomegaly. The hila and mediastinum are normal. Small effusion and opacity in left base is stable. No overt edema. IMPRESSION: Stable small left effusion with associated opacity. No other change. No overt edema. Electronically Signed   By: Dorise Bullion III M.D   On: 10/02/2017 09:44      Allergies as of 10/03/2017      Reactions   Penicillins    Has patient had a PCN reaction causing immediate rash, facial/tongue/throat swelling, SOB or lightheadedness with hypotension: Unknown Has patient had a PCN reaction causing severe rash involving mucus membranes or skin necrosis: Unknown Has patient had a PCN reaction that required hospitalization: Unknown Has patient had a PCN reaction occurring within the last 10 years: Unknown If all of the above answers are "NO", then may proceed with Cephalosporin use.   Celebrex [celecoxib]    Clindamycin/lincomycin    Latex    Sulfa Antibiotics    Strawberry Leaves Extract       Medication List    TAKE these medications   acetaminophen 500 MG tablet Commonly known as:  TYLENOL Take 1,000 mg by mouth 3 (three) times daily.   Calcium 600-200 MG-UNIT tablet Take 1 tablet by mouth daily.   citalopram 40 MG tablet Commonly known as:  CELEXA Take 40 mg by mouth daily.   dexlansoprazole 60 MG capsule Commonly known as:  DEXILANT Take 60 mg by mouth daily.   diclofenac sodium 1 % Gel Commonly known as:  VOLTAREN Apply topically 4 (four) times daily as needed for pain.   doxycycline 100 MG capsule Commonly known as:  VIBRAMYCIN Take 100 mg by mouth 2 (two) times daily.   ferrous sulfate 325 (65 FE) MG tablet Take 325 mg by mouth daily.   furosemide 40 MG tablet Commonly known as:  LASIX Take 40 mg by mouth daily.   gabapentin 100 MG capsule Commonly known as:  NEURONTIN Take 100 mg by mouth 3 (three) times daily.   levofloxacin 250 MG tablet Commonly known as:  LEVAQUIN Take 1 tablet (250 mg total) by mouth daily for 3 days.    levothyroxine 112 MCG tablet Commonly known as:  SYNTHROID, LEVOTHROID Take 112 mcg by mouth daily.   magnesium oxide 400 MG tablet Commonly known as:  MAG-OX Take 400 mg by mouth daily.   metoprolol tartrate 25 MG tablet Commonly known as:  LOPRESSOR Take 25 mg by mouth 2 (two) times daily.   nitroGLYCERIN 0.4 MG SL tablet Commonly known as:  NITROSTAT Place 0.4 mg under the tongue as needed for chest pain.   Olopatadine HCl 0.2 % Soln Place 1 drop into both eyes daily.   OXYCONTIN 10 mg 12 hr tablet Generic drug:  oxyCODONE Take 10 mg by mouth every 12 (twelve) hours.   polyethylene glycol packet Commonly known as:  MIRALAX / GLYCOLAX Take 17 g by mouth 2 (two) times daily.   REFRESH OP Place 1 drop into both eyes as needed (Dry eyes).   warfarin 4 MG tablet Commonly known as:  COUMADIN Take 2-4 mg by mouth daily. Take 2 mg by mouth on Monday, Wednesday, Friday and Sunday. Take 4 mg by mouth on all other days.            Durable Medical Equipment  (From admission, onward)         Start     Ordered   10/03/17 1104  DME Oxygen  Once    Question Answer Comment  Mode or (Route) Nasal cannula   Liters per Minute 2   Frequency Continuous (stationary and portable oxygen unit needed)   Oxygen conserving device Yes   Oxygen delivery system Gas      10/03/17 1103           Management plans discussed with the patient and she is in agreement. Stable for discharge   Patient should follow up with pcp  CODE STATUS:     Code Status Orders  (From admission, onward)         Start     Ordered   09/30/17 1105  Do not attempt resuscitation (DNR)  Continuous    Question Answer Comment  In the event of cardiac or respiratory ARREST Do not call a "code blue"   In the event of cardiac or respiratory ARREST Do not perform Intubation, CPR, defibrillation or ACLS   In the event of cardiac or respiratory ARREST Use medication by any route, position, wound care, and  other measures to relive pain and suffering. May use oxygen, suction and manual treatment of airway obstruction as needed for comfort.   Comments nurse may pronounce      09 /06/19 1105        Code Status History    This patient has a current code status but no historical code status.    Advance Directive Documentation     Most Recent Value  Type of Advance Directive  Out of facility DNR (pink MOST or yellow form), Healthcare Power of Attorney  Pre-existing out of facility DNR order (yellow form or pink MOST form)  Yellow form placed in chart (order not valid for inpatient use)  "MOST" Form in Place?  -      TOTAL TIME TAKING CARE OF THIS PATIENT: 38 minutes.    Note: This dictation was prepared with Dragon dictation along with smaller phrase technology. Any transcriptional errors that result from this process are unintentional.  Dajon Rowe M.D on 10/03/2017 at 11:05 AM  Between 7am to 6pm - Pager - [phone] After 6pm go to [email or web address] - password [email or web address]  Office  [phone]  CC: Primary care physician; Patient, No Pcp Per

## 2017-10-05 LAB — CULTURE, BLOOD (ROUTINE X 2)
CULTURE: NO GROWTH
CULTURE: NO GROWTH

## 2018-05-26 DEATH — deceased

## 2019-05-05 IMAGING — CR DG CHEST 2V
1 series · 2 of 2 positions shown · non-contrast
Comparison: September 30, 2017

CLINICAL DATA: Shortness of breath

EXAM:
CHEST - 2 VIEW

[Series 1: dg chest 2 view · 0.14mm/px · 2 of 2 slices shown]
[im 1/2]
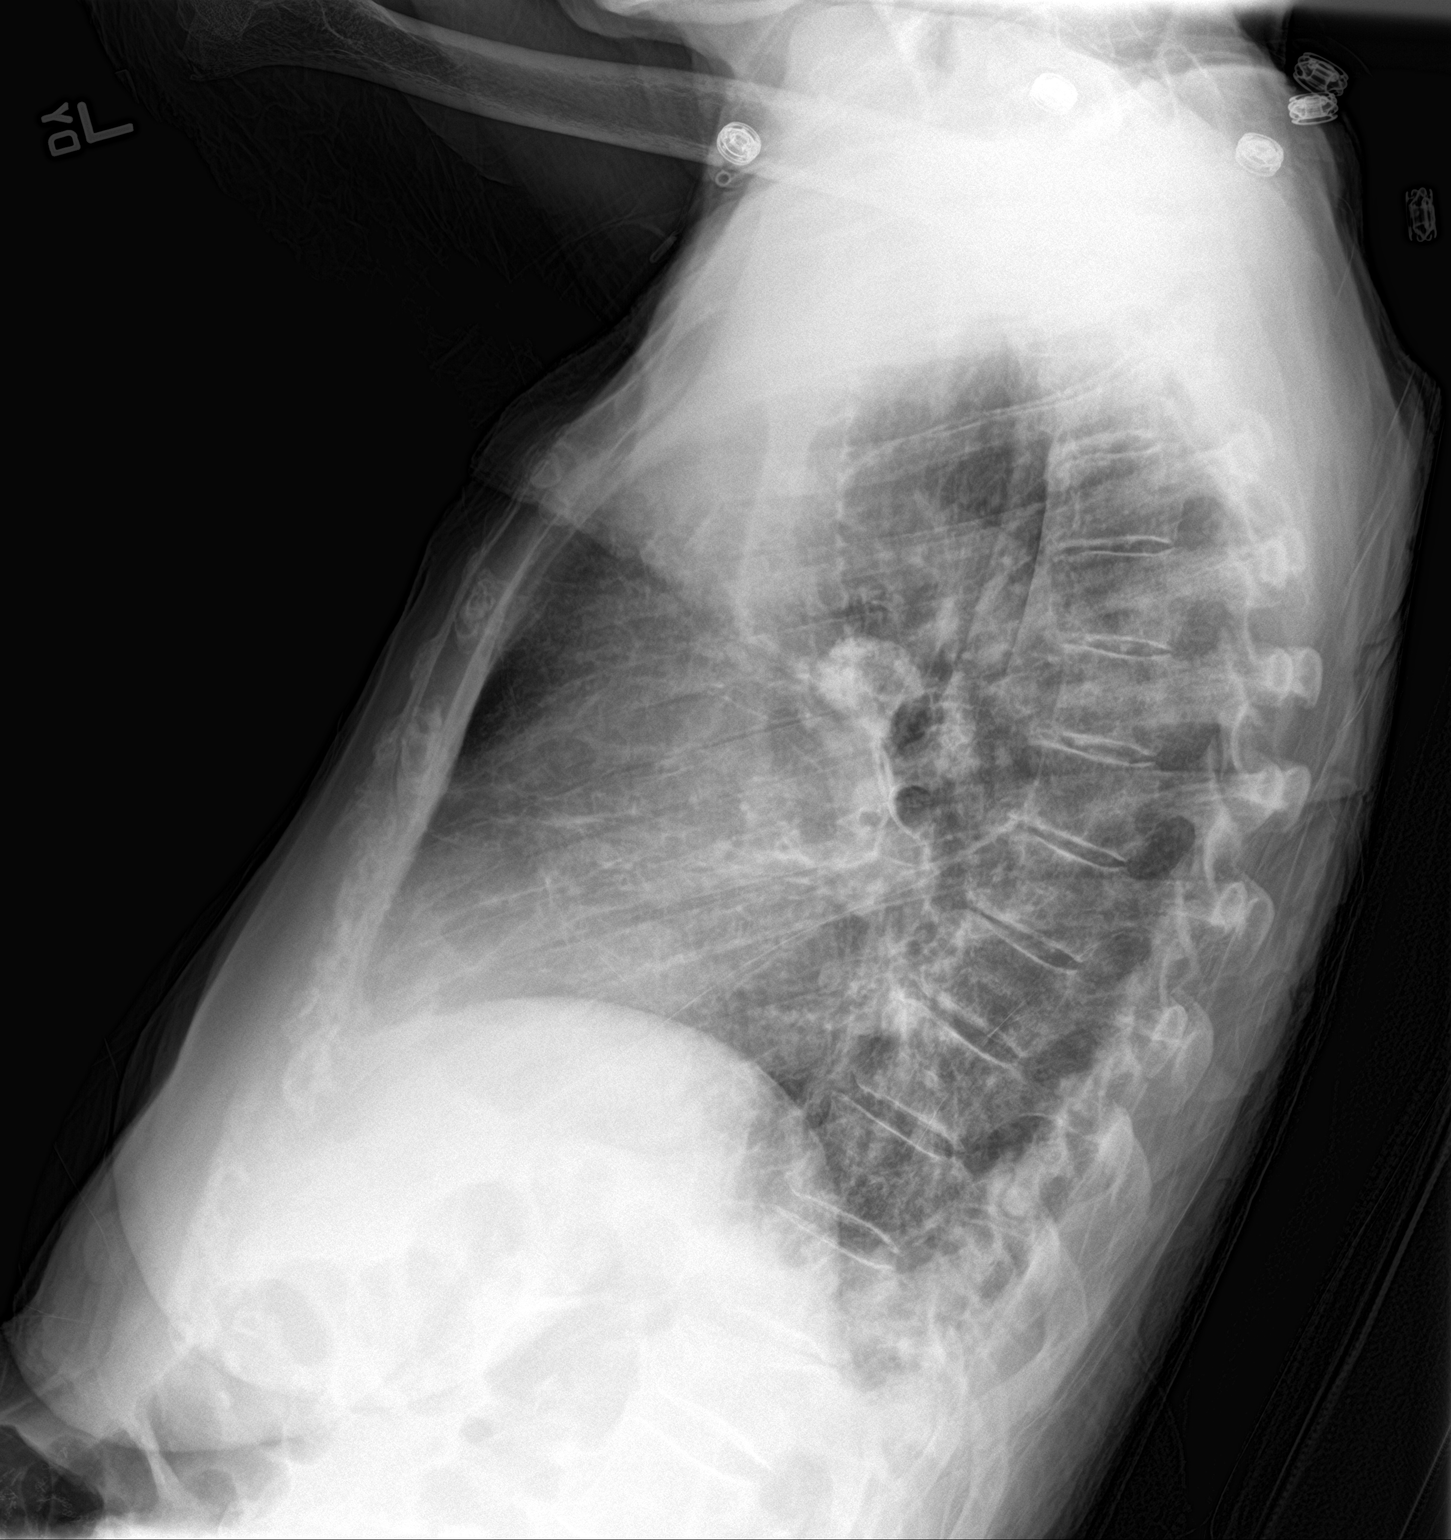
[im 2/2]
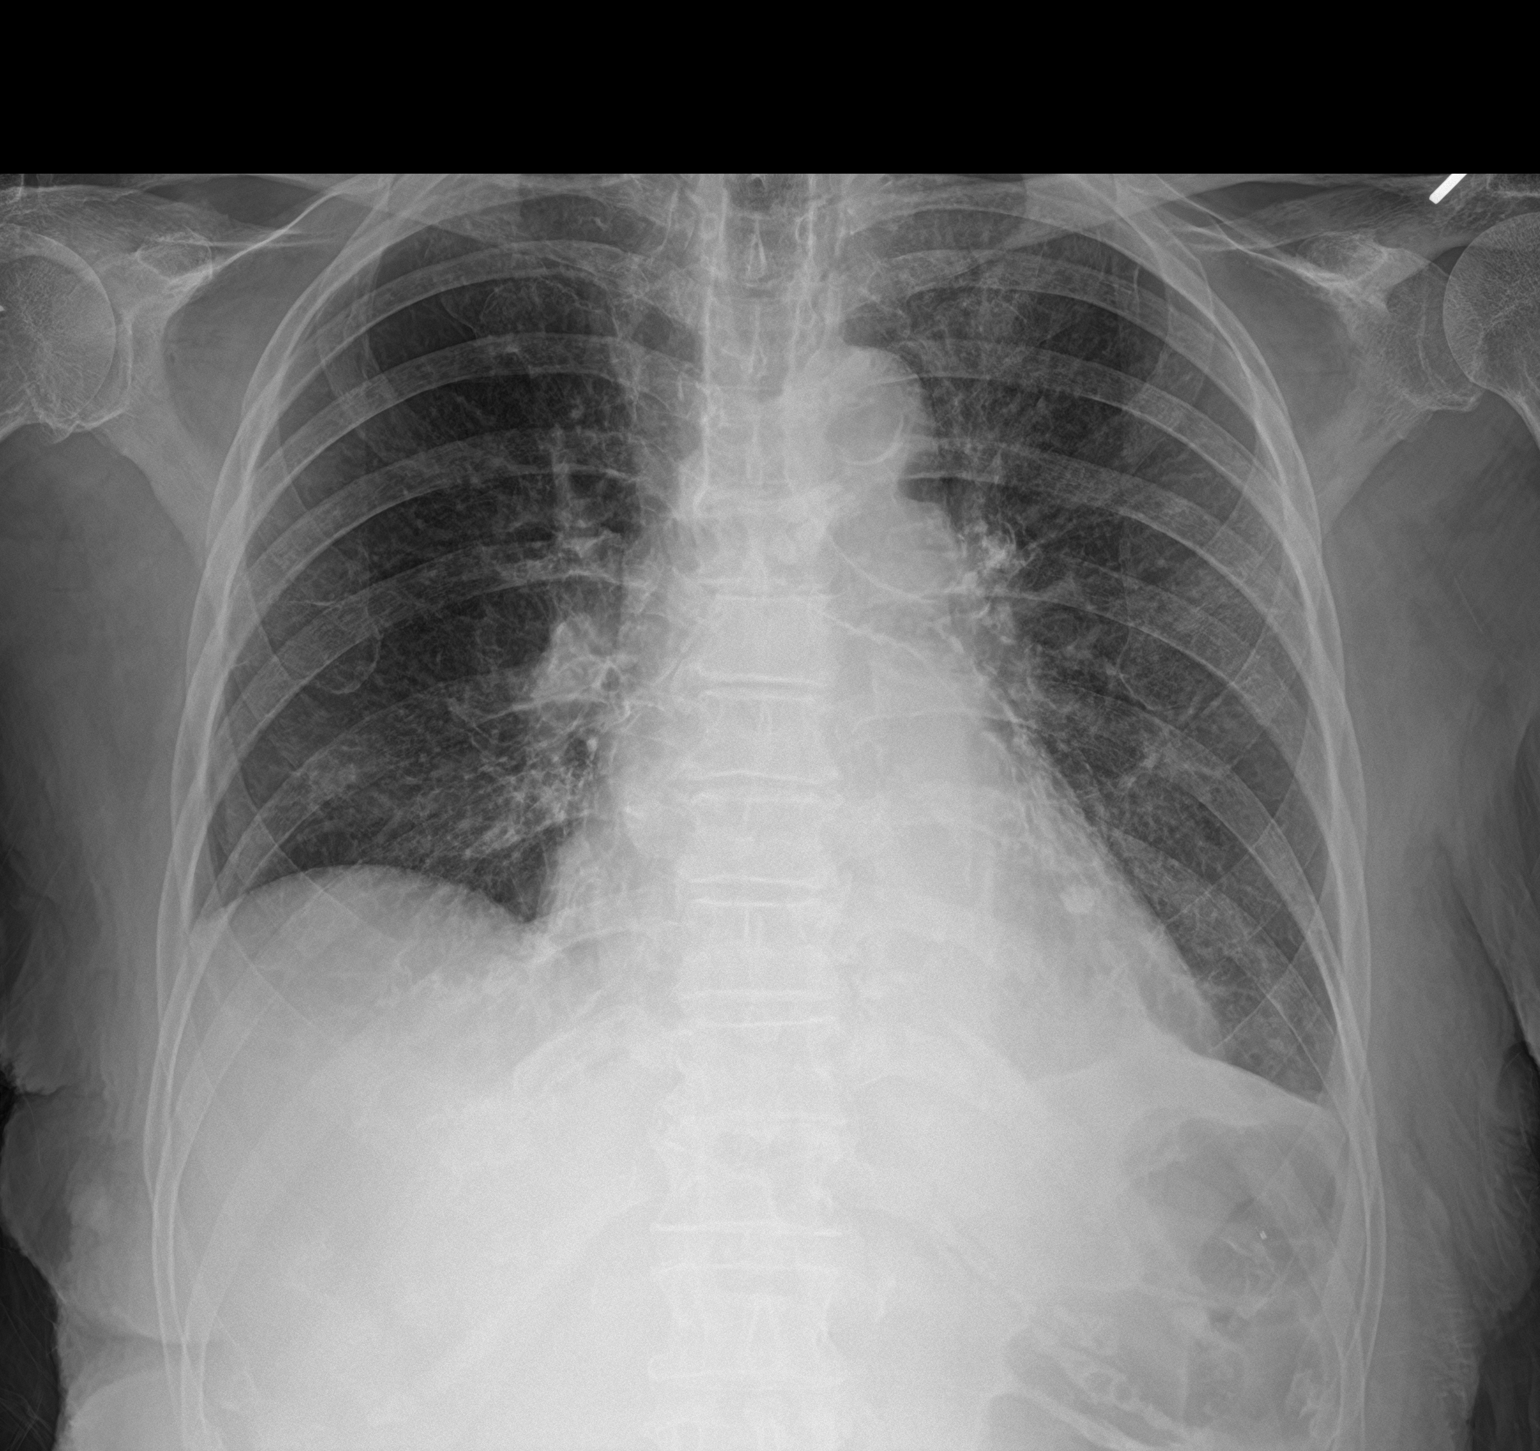

[2 of 2 positions shown; findings below may reference images not displayed]

FINDINGS: Mild cardiomegaly. The hila and mediastinum are normal. Small
effusion and opacity in left base is stable. No overt edema.
IMPRESSION: Stable small left effusion with associated opacity. No other change.
No overt edema.
